# Patient Record
Sex: Female | Born: 1940 | Race: White | Hispanic: No | Smoking: Never smoker
Health system: Southern US, Community
[De-identification: ages and names within clinical notes are randomized; demographics above are authoritative.]

## PROBLEM LIST (undated history)

## (undated) DIAGNOSIS — Z8719 Personal history of other diseases of the digestive system: Secondary | ICD-10-CM

## (undated) DIAGNOSIS — N329 Bladder disorder, unspecified: Secondary | ICD-10-CM

## (undated) DIAGNOSIS — K219 Gastro-esophageal reflux disease without esophagitis: Secondary | ICD-10-CM

## (undated) DIAGNOSIS — M1990 Unspecified osteoarthritis, unspecified site: Secondary | ICD-10-CM

## (undated) DIAGNOSIS — Z87442 Personal history of urinary calculi: Secondary | ICD-10-CM

---

## 2007-02-13 HISTORY — PX: PARTIAL COLECTOMY: SHX5273

## 2011-07-17 ENCOUNTER — Other Ambulatory Visit: Payer: Self-pay | Admitting: Urology

## 2011-07-20 ENCOUNTER — Other Ambulatory Visit (HOSPITAL_COMMUNITY): Payer: Self-pay | Admitting: Urology

## 2011-07-20 DIAGNOSIS — D49519 Neoplasm of unspecified behavior of unspecified kidney: Secondary | ICD-10-CM

## 2011-07-20 DIAGNOSIS — D490 Neoplasm of unspecified behavior of digestive system: Secondary | ICD-10-CM

## 2011-07-20 DIAGNOSIS — R310 Gross hematuria: Secondary | ICD-10-CM

## 2011-07-27 ENCOUNTER — Ambulatory Visit (HOSPITAL_COMMUNITY)
Admission: RE | Admit: 2011-07-27 | Discharge: 2011-07-27 | Disposition: A | Discharge status: Home / Self Care | Payer: MEDICARE | Source: Ambulatory Visit | Attending: Urology | Admitting: Urology

## 2011-07-27 DIAGNOSIS — K449 Diaphragmatic hernia without obstruction or gangrene: Secondary | ICD-10-CM | POA: Insufficient documentation

## 2011-07-27 DIAGNOSIS — K863 Pseudocyst of pancreas: Secondary | ICD-10-CM | POA: Insufficient documentation

## 2011-07-27 DIAGNOSIS — N281 Cyst of kidney, acquired: Secondary | ICD-10-CM | POA: Insufficient documentation

## 2011-07-27 DIAGNOSIS — D49519 Neoplasm of unspecified behavior of unspecified kidney: Secondary | ICD-10-CM

## 2011-07-27 DIAGNOSIS — D490 Neoplasm of unspecified behavior of digestive system: Secondary | ICD-10-CM

## 2011-07-27 DIAGNOSIS — R310 Gross hematuria: Secondary | ICD-10-CM | POA: Insufficient documentation

## 2011-07-27 DIAGNOSIS — K862 Cyst of pancreas: Secondary | ICD-10-CM | POA: Insufficient documentation

## 2011-07-27 DIAGNOSIS — K7689 Other specified diseases of liver: Secondary | ICD-10-CM | POA: Insufficient documentation

## 2011-07-27 MED ORDER — GADOBENATE DIMEGLUMINE 529 MG/ML IV SOLN
17.0000 mL | Freq: Once | INTRAVENOUS | Status: AC | PRN
  Administered 2011-07-27: 17 mL via INTRAVENOUS

## 2011-07-31 ENCOUNTER — Encounter (HOSPITAL_BASED_OUTPATIENT_CLINIC_OR_DEPARTMENT_OTHER): Payer: Self-pay | Admitting: *Deleted

## 2011-07-31 NOTE — Progress Notes (Signed)
NPO AFTER MN. ARRIVES AT 0930. NEEDS HG AND EKG. WILL TAKE PROTONIX AND LIPITOR AM OF SURG. W/ SIP OF WATER.

## 2011-08-06 ENCOUNTER — Encounter (HOSPITAL_BASED_OUTPATIENT_CLINIC_OR_DEPARTMENT_OTHER): Payer: Self-pay

## 2011-08-06 ENCOUNTER — Encounter (HOSPITAL_BASED_OUTPATIENT_CLINIC_OR_DEPARTMENT_OTHER)
Admission: RE | Disposition: A | Discharge status: Home / Self Care | Payer: Self-pay | Source: Ambulatory Visit | Attending: Urology

## 2011-08-06 ENCOUNTER — Encounter (HOSPITAL_BASED_OUTPATIENT_CLINIC_OR_DEPARTMENT_OTHER): Payer: Self-pay | Admitting: *Deleted

## 2011-08-06 ENCOUNTER — Ambulatory Visit (HOSPITAL_BASED_OUTPATIENT_CLINIC_OR_DEPARTMENT_OTHER)
Admission: RE | Admit: 2011-08-06 | Discharge: 2011-08-06 | Disposition: A | Discharge status: Home / Self Care | Payer: MEDICARE | Source: Ambulatory Visit | Attending: Urology | Admitting: Urology

## 2011-08-06 ENCOUNTER — Ambulatory Visit (HOSPITAL_BASED_OUTPATIENT_CLINIC_OR_DEPARTMENT_OTHER): Payer: MEDICARE

## 2011-08-06 ENCOUNTER — Encounter (HOSPITAL_BASED_OUTPATIENT_CLINIC_OR_DEPARTMENT_OTHER): Payer: Self-pay | Admitting: Anesthesiology

## 2011-08-06 DIAGNOSIS — Z79899 Other long term (current) drug therapy: Secondary | ICD-10-CM | POA: Insufficient documentation

## 2011-08-06 DIAGNOSIS — Z8739 Personal history of other diseases of the musculoskeletal system and connective tissue: Secondary | ICD-10-CM | POA: Insufficient documentation

## 2011-08-06 DIAGNOSIS — E785 Hyperlipidemia, unspecified: Secondary | ICD-10-CM | POA: Insufficient documentation

## 2011-08-06 DIAGNOSIS — R310 Gross hematuria: Secondary | ICD-10-CM | POA: Insufficient documentation

## 2011-08-06 DIAGNOSIS — K219 Gastro-esophageal reflux disease without esophagitis: Secondary | ICD-10-CM | POA: Insufficient documentation

## 2011-08-06 DIAGNOSIS — K449 Diaphragmatic hernia without obstruction or gangrene: Secondary | ICD-10-CM | POA: Insufficient documentation

## 2011-08-06 DIAGNOSIS — N210 Calculus in bladder: Secondary | ICD-10-CM

## 2011-08-06 DIAGNOSIS — D303 Benign neoplasm of bladder: Secondary | ICD-10-CM | POA: Insufficient documentation

## 2011-08-06 HISTORY — PX: CYSTOSCOPY WITH BIOPSY: SHX5122

## 2011-08-06 HISTORY — DX: Bladder disorder, unspecified: N32.9

## 2011-08-06 HISTORY — DX: Gastro-esophageal reflux disease without esophagitis: K21.9

## 2011-08-06 HISTORY — DX: Personal history of urinary calculi: Z87.442

## 2011-08-06 HISTORY — DX: Unspecified osteoarthritis, unspecified site: M19.90

## 2011-08-06 HISTORY — DX: Personal history of other diseases of the digestive system: Z87.19

## 2011-08-06 SURGERY — CYSTOSCOPY, WITH BIOPSY
Anesthesia: General | Site: Bladder | Wound class: Clean Contaminated

## 2011-08-06 MED ORDER — FENTANYL CITRATE 0.05 MG/ML IJ SOLN
INTRAMUSCULAR | Status: DC | PRN
  Administered 2011-08-06 (×4): 25 ug via INTRAVENOUS

## 2011-08-06 MED ORDER — BELLADONNA ALKALOIDS-OPIUM 16.2-60 MG RE SUPP
RECTAL | Status: DC | PRN
  Administered 2011-08-06: 1 via RECTAL

## 2011-08-06 MED ORDER — KETOROLAC TROMETHAMINE 30 MG/ML IJ SOLN: 15.0000 mg | Freq: Once | INTRAMUSCULAR | Status: DC | PRN

## 2011-08-06 MED ORDER — CIPROFLOXACIN IN D5W 200 MG/100ML IV SOLN
200.0000 mg | INTRAVENOUS | Status: AC
  Administered 2011-08-06: 200 mg via INTRAVENOUS

## 2011-08-06 MED ORDER — STERILE WATER FOR IRRIGATION IR SOLN
Status: DC | PRN
  Administered 2011-08-06: 3000 mL

## 2011-08-06 MED ORDER — LACTATED RINGERS IV SOLN
INTRAVENOUS | Status: DC | PRN
  Administered 2011-08-06: 10:00:00 via INTRAVENOUS

## 2011-08-06 MED ORDER — PROMETHAZINE HCL 25 MG/ML IJ SOLN: 6.2500 mg | INTRAMUSCULAR | Status: DC | PRN

## 2011-08-06 MED ORDER — FENTANYL CITRATE 0.05 MG/ML IJ SOLN: 25.0000 ug | INTRAMUSCULAR | Status: DC | PRN

## 2011-08-06 MED ORDER — LACTATED RINGERS IV SOLN
INTRAVENOUS | Status: DC
  Administered 2011-08-06: 10:00:00 via INTRAVENOUS

## 2011-08-06 MED ORDER — PROPOFOL 10 MG/ML IV EMUL
INTRAVENOUS | Status: DC | PRN
  Administered 2011-08-06: 180 mg via INTRAVENOUS

## 2011-08-06 MED ORDER — ONDANSETRON HCL 4 MG/2ML IJ SOLN
INTRAMUSCULAR | Status: DC | PRN
  Administered 2011-08-06: 4 mg via INTRAVENOUS

## 2011-08-06 MED ORDER — PHENAZOPYRIDINE HCL 200 MG PO TABS
200.0000 mg | ORAL_TABLET | Freq: Once | ORAL | Status: AC
  Administered 2011-08-06: 200 mg via ORAL

## 2011-08-06 MED ORDER — KETOROLAC TROMETHAMINE 30 MG/ML IJ SOLN
INTRAMUSCULAR | Status: DC | PRN
  Administered 2011-08-06: 30 mg via INTRAVENOUS

## 2011-08-06 MED ORDER — LIDOCAINE HCL (CARDIAC) 20 MG/ML IV SOLN
INTRAVENOUS | Status: DC | PRN
  Administered 2011-08-06: 60 mg via INTRAVENOUS

## 2011-08-06 MED ORDER — DEXAMETHASONE SODIUM PHOSPHATE 4 MG/ML IJ SOLN
INTRAMUSCULAR | Status: DC | PRN
  Administered 2011-08-06: 4 mg via INTRAVENOUS

## 2011-08-06 MED ORDER — PHENAZOPYRIDINE HCL 200 MG PO TABS: 200.0000 mg | ORAL_TABLET | Freq: Three times a day (TID) | ORAL | Status: AC | PRN

## 2011-08-06 SURGICAL SUPPLY — 15 items
BAG DRAIN URO-CYSTO SKYTR STRL (DRAIN) ×2 IMPLANT
CANISTER SUCT LVC 12 LTR MEDI- (MISCELLANEOUS) ×2 IMPLANT
CLOTH BEACON ORANGE TIMEOUT ST (SAFETY) ×2 IMPLANT
DRAPE CAMERA CLOSED 9X96 (DRAPES) ×2 IMPLANT
ELECT REM PT RETURN 9FT ADLT (ELECTROSURGICAL) ×2
ELECTRODE REM PT RTRN 9FT ADLT (ELECTROSURGICAL) ×1 IMPLANT
GLOVE BIO SURGEON STRL SZ8 (GLOVE) ×2 IMPLANT
GOWN PREVENTION PLUS LG XLONG (DISPOSABLE) ×2 IMPLANT
GOWN STRL NON-REIN LRG LVL3 (GOWN DISPOSABLE) ×2 IMPLANT
GOWN STRL REIN XL XLG (GOWN DISPOSABLE) ×4 IMPLANT
IV NS IRRIG 3000ML ARTHROMATIC (IV SOLUTION) IMPLANT
NEEDLE HYPO 22GX1.5 SAFETY (NEEDLE) IMPLANT
NS IRRIG 500ML POUR BTL (IV SOLUTION) IMPLANT
PACK CYSTOSCOPY (CUSTOM PROCEDURE TRAY) ×2 IMPLANT
WATER STERILE IRR 3000ML UROMA (IV SOLUTION) ×2 IMPLANT

## 2011-08-06 NOTE — Op Note (Signed)
PATIENT:  Shannon Villegas  PRE-OPERATIVE DIAGNOSIS:  Bladder lesion  POST-OPERATIVE DIAGNOSIS:  Same  PROCEDURE:  Procedure(s): Cystoscopy with cold cup bladder biopsy  SURGEON:  Garnett Farm  INDICATION: Mrs. Messing is a 71 year old female who had experienced gross hematuria and was evaluated with a CT scan that revealed no abnormality of the upper tract but there was a stone seen in the bladder. Cystoscopically I found, in my office, an 8 mm Jack stone in her bladder which I dislodged from where it was attached on the bladder wall and extracted the stone. The patient was having a lot of irritative voiding symptoms which resolved with removal of the stone. It was attached to an area that appeared abnormal and we discussed the fact that it could be inflammation versus transitional cell carcinoma and that further evaluation with a bladder biopsy was indicated.  ANESTHESIA:  General  EBL:  Minimal  DRAINS: None  LOCAL MEDICATIONS USED:  None  SPECIMEN:  Cold cup biopsy from the lesion on the floor of the bladder just medial to the left ureteral orifice  DISPOSITION OF SPECIMEN:  PATHOLOGY  Description of procedure: After informed consent the patient was taken to the major OR, placed on the table and administered general anesthesia. During placement of her LMA a tooth was dislodged. It was extremely loose initially and appeared to be in poor shape. It was removed and placed in a cup. Once under anesthesia she was moved to the dorsal lithotomy position and her genitalia sterilely prepped and draped. An official timeout was then performed.  Rigid cystoscopy was performed using the 22 French cystoscope with 12 lens initially. Ureter was noted be normal. The bladder was entered and the ureteral orifices were again noted to be a normal position and configuration. The area where the stone had been adherent to the floor of the bladder was again identified although the degree of apparent  inflammation was somewhat decreased. The bladder was fully inspected with both the 12 and 70 lenses and no other lesions were identified.  A cold cup biopsy forceps was then passed under direct vision in the bladder and 2 cold cup biopsies were obtained from the area in question on the floor of the bladder and sent to pathology. I then fulgurated this region with the Bugbee electrode and no bleeding was noted at the end of the procedure. The bladder was drained, the cystoscope removed and the patient awakened and taken recovery room in stable and satisfactory condition. She tolerated the procedure well no intraoperative complications.  PLAN  OF CARE: Discharge to home after PACU  PATIENT DISPOSITION:  PACU - hemodynamically stable.

## 2011-08-06 NOTE — Discharge Instructions (Signed)
Post Bladder Surgery Instructions ° ° °General instructions: °   ° Your recent bladder surgery requires very little post hospital care but some definite precautions. ° °Despite the fact that no skin incisions were used, the area around the bladder incisions are raw and covered with scabs to promote healing and prevent bleeding. Certain precautions are needed to insure that the scabs are not disturbed over the next 2-4 weeks while the healing proceeds. ° °Because the raw surface inside your bladder and the irritating effects of urine you may expect frequency of urination and/or urgency (a stronger desire to urinate) and perhaps even getting up at night more often. This will usually resolve or improve slowly over the healing period. You may see some blood in your urine over the first 6 weeks. Do not be alarmed, even if the urine was clear for a while. Get off your feet and drink lots of fluids until clearing occurs. If you start to pass clots or don't improve call us. ° °Catheter: (If you are discharged with a catheter.) ° °1. Keep your catheter secured to your leg at all times with tape or the supplied strap. °2. You may experience leakage of urine around your catheter- as long as the  °catheter continues to drain, this is normal.  If your catheter stops draining  °go to the ER. °3. You may also have blood in your urine, even after it has been clear for  °several days; you may even pass some small blood clots or other material.  This  °is normal as well.  If this happens, sit down and drink plenty of water to help  °make urine to flush out your bladder.  If the blood in your urine becomes worse  °after doing this, contact our office or return to the ER. °4. You may use the leg bag (small bag) during the day, but use the large bag at  °night. ° °Diet: ° °You may return to your normal diet immediately. Because of the raw surface of your bladder, alcohol, spicy foods, foods high in acid and drinks with caffeine may  cause irritation or frequency and should be used in moderation. To keep your urine flowing freely and avoid constipation, drink plenty of fluids during the day (8-10 glasses). Tip: Avoid cranberry juice because it is very acidic. ° °Activity: ° °Your physical activity doesn't need to be restricted. However, if you are very active, you may see some blood in the urine. We suggest that you reduce your activity under the circumstances until the bleeding has stopped. ° °Bowels: ° °It is important to keep your bowels regular during the postoperative period. Straining with bowel movements can cause bleeding. A bowel movement every other day is reasonable. Use a mild laxative if needed, such as milk of magnesia 2-3 tablespoons, or 2 Dulcolax tablets. Call if you continue to have problems. If you had been taking narcotics for pain, before, during or after your surgery, you may be constipated. Take a laxative if necessary. ° ° ° °Medication: ° °You should resume your pre-surgery medications unless told not to. In addition you may be given an antibiotic to prevent or treat infection. Antibiotics are not always necessary. All medication should be taken as prescribed until the bottles are finished unless you are having an unusual reaction to one of the drugs. ° ° °Post Anesthesia Home Care Instructions ° °Activity: °Get plenty of rest for the remainder of the day. A responsible adult should stay with you for   24 hours following the procedure.  °For the next 24 hours, DO NOT: °-Drive a car °-Operate machinery °-Drink alcoholic beverages °-Take any medication unless instructed by your physician °-Make any legal decisions or sign important papers. ° °Meals: °Start with liquid foods such as gelatin or soup. Progress to regular foods as tolerated. Avoid greasy, spicy, heavy foods. If nausea and/or vomiting occur, drink only clear liquids until the nausea and/or vomiting subsides. Call your physician if vomiting continues. ° °Special  Instructions/Symptoms: °Your throat may feel dry or sore from the anesthesia or the breathing tube placed in your throat during surgery. If this causes discomfort, gargle with warm salt water. The discomfort should disappear within 24 hours. ° ° °

## 2011-08-06 NOTE — Progress Notes (Signed)
Spoke with patient post operatively about tooth removal during induction. Also informed patient about severely loose tooth on opposite side. Only one tooth on bottom appears to be fully anchored. Patient voiced understanding

## 2011-08-06 NOTE — H&P (Signed)
History of Present Illness         Gross hematuria: The patient reported approximately 3 weeks of dysuria and frequency prior to being seen by Dr. Ivory Broad on 06/17/11. A urinalysis at that time was positive for blood by dipstick and urine culture was performed and found to be negative. She reports she first saw gross hematuria about 7 weeks ago and then her urine was visibly clear but also had another episode of gross hematuria as recently 24 hours prior to her last visit.   She does have nocturia 4-5X which she says she has had all of her life. She doesn't really have any daytime voiding symptoms but may have some slight urinary frequency.   Interval history: She notes that she has had less irritative symptoms since I have seen her. No further gross hematuria noted.   Past Medical History Problems  1. History of  Esophageal Reflux 530.81 2. History of  Hiatal Hernia 553.3 3. History of  Hyperlipidemia 272.4 4. History of  Osteoarthritis V13.4  Surgical History Problems  1. History of  Colon Surgery  Current Meds 1. CeleBREX 200 MG Oral Capsule; Therapy: (Recorded:23May2013) to 2. Cyclobenzaprine HCl 5 MG Oral Tablet; Therapy: (Recorded:23May2013) to 3. Iron TABS; Therapy: (Recorded:23May2013) to 4. Lipitor 40 MG Oral Tablet; Therapy: (Recorded:23May2013) to 5. Pantoprazole Sodium 40 MG Oral Tablet Delayed Release; Therapy: (Recorded:23May2013) to 6. Vitamin D 400 UNIT Oral Capsule; Therapy: (Recorded:23May2013) to  Allergies Medication  1. No Known Drug Allergies  Family History Problems  1. Maternal history of  Diabetes Mellitus V18.0 2. Paternal history of  Heart Disease V17.49 3. Sororal history of  Hypertension V17.49 4. Sororal history of  Lung Cancer V16.1  Social History Problems  1. Marital History - Widowed 2. Never A Smoker Denied  3. History of  Alcohol Use  Review of Systems Genitourinary, constitutional, skin, eye, otolaryngeal, hematologic/lymphatic,  cardiovascular, pulmonary, endocrine, musculoskeletal, gastrointestinal, neurological and psychiatric system(s) were reviewed and pertinent findings if present are noted.  Genitourinary: urinary frequency, dysuria, nocturia and hematuria.    Vitals Vital Signs  Blood Pressure: 152 / 86 Heart Rate: 80  BMI Calculated: 29.41 BSA Calculated: 1.93 Height: 5 ft 6 in Weight: 183 lb   Physical Exam Constitutional: Well nourished and well developed . No acute distress.  ENT:. The ears and nose are normal in appearance.  Neck: The appearance of the neck is normal and no neck mass is present.  Pulmonary: No respiratory distress and normal respiratory rhythm and effort.  Cardiovascular: Heart rate and rhythm are normal . No peripheral edema.  Abdomen: The abdomen is soft and nontender. No masses are palpated. No CVA tenderness. No hernias are palpable. No hepatosplenomegaly noted.  Lymphatics: The femoral and inguinal nodes are not enlarged or tender.  Skin: Normal skin turgor, no visible rash and no visible skin lesions.  Neuro/Psych:. Mood and affect are appropriate.    Results/Data  The following images/tracing/specimen were independently visualized:  CT scan as below.  The following clinical lab reports were reviewed:  Creatinine as below.  The following radiology reports were reviewed: CT scan. Selected Results  AU CT-HEMATURIA PROTOCOL  Ihor Gully   Test Name Result Flag Reference  ** RADIOLOGY REPORT BY [facility] ** ORIGINAL APPROVED BY: Florencia Reasons, M.D. ON: 07/12/2011 13:04:37   *RADIOLOGY REPORT*  Clinical Data: Gross hematuria.  CT ABDOMEN AND PELVIS WITHOUT AND WITH CONTRAST  Technique: Multidetector CT imaging of the abdomen and pelvis was performed without  contrast material in one or both body regions, followed by contrast material(s) and further sections in one or both body regions.  Contrast: 125 ml of Isovue 300.  Comparison: No  priors.  Findings:  Lung Bases: Massive hiatal hernia with a large portion of the stomach in an intrathoracic position. Otherwise, unremarkable.  Abdomen/Pelvis: Noncontrast images demonstrate no definite calcifications within the collecting system of either kidney or along the course of either ureter. Image 85 of series 2 demonstrates a large calcification within the dependent portion of the urinary bladder adjacent to the urethral orifice. Additionally, image number 37 of series 2 demonstrates some ill- defined calcifications in the lateral aspect of the lower pole of the right kidney. This corresponds to a somewhat irregular shaped lesion in the measures 1.8 x 1.3 cm, which measures approximately 26 HU on precontrast images, 48 HU on portal venous phase images, and 26 HU on the post contrast delayed images, concerning for an enhancing lesion. No additional renal lesions are otherwise noted. Post contrast delayed images demonstrate no definite filling defects within the collecting system of either kidney, along the course of either ureter (please note that the distal half of the right ureter was incompletely opacified on delayed images), or within the lumen of the urinary bladder to strongly suggest the presence of a urothelial neoplasm.  In segment 3 of the liver (image 33 of series 3) there is a 1.2 cm lesion which measures 30 HU on noncontrast images increases to 47 HU on the portal venous phase images (45 HU on delayed images). No additional hepatic lesions are otherwise noted. The appearance of the gallbladder, pancreas, spleen and bilateral adrenal glands is unremarkable. There is extensive atherosclerosis throughout the abdominal and pelvic vasculature, without evidence of aneurysm or dissection. There appear to be single renal arteries bilaterally. There is no ascites or pneumoperitoneum and no pathologic distension of bowel. Postoperative changes of the  right hemicolectomy are noted. No definite pathologic lymphadenopathy identified within the abdomen or pelvis. In the anterior aspect of the uterine body there is a 3.7 x 4.3 x 4.1 cm avidly enhancing lesion, most compatible with a fibroid. Laxity of the levator ani musculature and low-lying pelvic organs suggests pelvic organ prolapse.  Musculoskeletal: There are no aggressive appearing lytic or blastic lesions noted in the visualized portions of the skeleton. 6 mm of anterolisthesis of L4 upon L5 is noted.  IMPRESSION: 1. 7 mm calcification in the inferior aspect of the urinary bladder adjacent to the urethral orifice presumably represents a urinary tract calculus. No other calculi are otherwise noted. 2. Indeterminate 1.8 x 1.3 cm lesion in the right kidney which has some fine internal calcifications and appears to demonstrate some potential enhancement. This is suspicious, and warrants further evaluation with MRI to exclude a cystic renal neoplasm. 3. In addition, there is a 1.2 cm lesion in the posterior aspect of segment three of the liver which has indeterminate imaging characteristics, but demonstrates potential enhancement. This too could be better evaluated with contrast enhanced MRI of the abdomen. 4. 3.7 x 4.3 x 4.1 cm avidly enhancing lesion in the anterior aspect of the uterine body presumably represents a fibroid. 5. Laxity of the levator ani musculature and low-lying pelvic organs suggests underlying pelvic organ prolapse. Clinical correlation may be warranted. 6. 6 mm of anterolisthesis of L4 upon L5.   BUN & CREATININE  Ihor Gully  SPECIMEN TYPE: BLOOD   Test Name Result Flag Reference  CREATININE 0.68 mg/dL  9.60-4.54  BUN 22 mg/dL  1-61  Est GFR, African American >89 mL/min    Est GFR, NonAfrican American 88 mL/min    The estimated GFR is a calculation valid for adults (4 to 73 years old) that uses the CKD-EPI algorithm to adjust for age and sex.  It is not to be used for children, pregnant women, hospitalized patients, patients on dialysis, or with rapidly changing kidney function. According to the NKDEP, eGFR >89 is normal, 60-89 shows mild impairment, 30-59 shows moderate impairment, 15-29 shows severe impairment and <15 is ESRD.   Procedure  Procedure: Cystoscopy  Chaperone Present: .  Indication: Hematuria.  Informed Consent: Risks, benefits, and potential adverse events were discussed and informed consent was obtained from the patient.  Prep: The patient was prepped with betadine.  Procedure Note:  Urethral meatus:. No abnormalities.  Anterior urethra: No abnormalities.  Bladder: Visulization was clear. The ureteral orifices were in the normal anatomic position bilaterally and had clear efflux of urine. A systematic survey of the bladder demonstrated no bladder tumors or stones. The mucosa was smooth without abnormalities. Examination of the bladder demonstrated edema located near the left ureteral orifice of the bladder. She also had a stone in her bladder. It appeared to be a jack stone configuration but only measured about 8 mm in size. I was able to dislodge it from the floor of the bladder and grasped it with graspers and was able to extract the stone without difficulty. The patient tolerated the procedure well.  Complications: None.    Assessment Assessed  1. Gross Hematuria 599.71 2. Bladder Calculus 594.1 3. Possible  Renal Neoplasm Right 239.5 4. Possible  Liver Neoplasm 239.0      I went over the results of her workup so far which is revealed a normal serum creatinine. The CT scan has revealed a cystic lesion within the right kidney that measured 1.8 cm in greatest diameter and had some stippled calcifications in the wall which would make it a Bosniak class IIF cyst at the least and could possibly be a cystic renal neoplasm. This does warrant further evaluation but likely did not cause the gross hematuria. There also  did appear to be a possible lesion within the liver as well. The radiologist mentioned a calcification in the bladder that could be a bladder stone although my concern was that of dystrophic calcification of a bladder tumor. Cystoscopically what I found was a definite bladder calculi that appear to be adherent to the bladder mucosa just medial to the left ureteral orifice. There was surrounding abnormality of the mucosa that could be inflammation due to irritation from a stone versus transitional cell carcinoma. I discussed that with the patient and the need to biopsy this region. I went over the procedure with her in detail including its risks and complications. Now that the stone is gone if it is inflammatory in nature that should begin to resolve.   Plan       1. MRI scan of the abdomen with and without contrast. 2. She will be scheduled for outpatient bladder biopsy. 3. Her stone will be sent for compositional analysis.   Addendum: MRI scan was obtained and revealed the area in within the liver appeared to be a simple cyst. End in addition the right renal lesion was noted to be a complex cyst Bosniak class II.

## 2011-08-06 NOTE — Transfer of Care (Signed)
Immediate Anesthesia Transfer of Care Note  Patient: Shannon Villegas  Procedure(s) Performed: Procedure(s) (LRB): CYSTOSCOPY WITH BIOPSY (N/A)  Patient Location: PACU  Anesthesia Type: General  Level of Consciousness: awake, sedated, patient cooperative and responds to stimulation  Airway & Oxygen Therapy: Patient Spontanous Breathing and Patient connected to face mask oxygen  Post-op Assessment: Report given to PACU RN, Post -op Vital signs reviewed and stable and Patient moving all extremities  Post vital signs: Reviewed and stable  Complications: No apparent anesthesia complications . Note lower right tooth came out during LMA placement. Socket with minimum bloody drainage. No distress with airway management. Teeth poor preop. All loose along bottom, only one more intact than other 2 remaining. Discussed with patient.  Her response was " It doesn't surprise me" when told her tooth came out. SaO2 stable awake and alert. Report and tooth labeled in specimen cup given to PACU RN.

## 2011-08-06 NOTE — Interval H&P Note (Signed)
History and Physical Interval Note:  08/06/2011 10:41 AM  Shannon Villegas  has presented today for surgery, with the diagnosis of Bladder Lesion  The various methods of treatment have been discussed with the patient and family. After consideration of risks, benefits and other options for treatment, the patient has consented to  Procedure(s) (LRB): CYSTOSCOPY WITH BIOPSY (N/A) as a surgical intervention .  The patient's history has been reviewed, patient examined, no change in status, stable for surgery.  I have reviewed the patients' chart and labs.  Questions were answered to the patient's satisfaction.     Garnett Farm

## 2011-08-06 NOTE — Anesthesia Procedure Notes (Signed)
Procedure Name: LMA Insertion Date/Time: 08/06/2011 10:50 AM Performed by: Jessica Priest Pre-anesthesia Checklist: Patient identified, Emergency Drugs available, Suction available and Patient being monitored Patient Re-evaluated:Patient Re-evaluated prior to inductionOxygen Delivery Method: Circle System Utilized Preoxygenation: Pre-oxygenation with 100% oxygen Intubation Type: IV induction Ventilation: Mask ventilation without difficulty LMA: LMA inserted LMA Size: 4.0 Number of attempts: 1 Airway Equipment and Method: bite block Placement Confirmation: positive ETCO2 Tube secured with: Tape Dental Injury: Teeth and Oropharynx as per pre-operative assessment and Dental damage  Comments: Poor dentition, several teeth loose on bottom, inserted LMA supreme gently, lower right tooth came out, left lower tooth very loose, caution with taping LMA, Dr Okey Dupre at bedside. No distress with ventilation, Sa02 stable.

## 2011-08-06 NOTE — Anesthesia Postprocedure Evaluation (Signed)
  Anesthesia Post-op Note  Patient: Shannon Villegas  Procedure(s) Performed: Procedure(s) (LRB): CYSTOSCOPY WITH BIOPSY (N/A)  Patient Location: PACU  Anesthesia Type: General  Level of Consciousness: awake and alert   Airway and Oxygen Therapy: Patient Spontanous Breathing  Post-op Pain: mild  Post-op Assessment: Post-op Vital signs reviewed, Patient's Cardiovascular Status Stable, Respiratory Function Stable, Patent Airway and No signs of Nausea or vomiting  Post-op Vital Signs: stable  Complications: tooth removed during induction b/c of looseness

## 2011-08-06 NOTE — Anesthesia Preprocedure Evaluation (Addendum)
Anesthesia Evaluation  Patient identified by MRN, date of birth, ID band Patient awake    Reviewed: Allergy & Precautions, H&P , NPO status , Patient's Chart, lab work & pertinent test results  Airway Mallampati: II TM Distance: <3 FB Neck ROM: Full    Dental  (+) Upper Dentures, Partial Lower, Loose, Poor Dentition and Dental Advisory Given,  High possibility of losing 1 or 2 teeth during airway placement:   Pulmonary neg pulmonary ROS,  breath sounds clear to auscultation  Pulmonary exam normal       Cardiovascular negative cardio ROS  Rhythm:Regular Rate:Normal     Neuro/Psych negative neurological ROS  negative psych ROS   GI/Hepatic Neg liver ROS, GERD-  Medicated,  Endo/Other  negative endocrine ROS  Renal/GU negative Renal ROS  negative genitourinary   Musculoskeletal negative musculoskeletal ROS (+)   Abdominal   Peds negative pediatric ROS (+)  Hematology negative hematology ROS (+)   Anesthesia Other Findings   Reproductive/Obstetrics negative OB ROS                        Anesthesia Physical Anesthesia Plan  ASA: II  Anesthesia Plan: General   Post-op Pain Management:    Induction: Intravenous  Airway Management Planned: LMA  Additional Equipment:   Intra-op Plan:   Post-operative Plan:   Informed Consent: I have reviewed the patients History and Physical, chart, labs and discussed the procedure including the risks, benefits and alternatives for the proposed anesthesia with the patient or authorized representative who has indicated his/her understanding and acceptance.   Dental advisory given  Plan Discussed with:   Anesthesia Plan Comments:         Anesthesia Quick Evaluation

## 2011-08-07 LAB — POCT HEMOGLOBIN-HEMACUE: Hemoglobin: 11.5 g/dL — ABNORMAL LOW (ref 12.0–15.0)

## 2011-08-08 ENCOUNTER — Encounter (HOSPITAL_BASED_OUTPATIENT_CLINIC_OR_DEPARTMENT_OTHER): Payer: Self-pay | Admitting: Urology

## 2012-07-19 DIAGNOSIS — M545 Low back pain, unspecified: Secondary | ICD-10-CM | POA: Insufficient documentation

## 2012-07-19 DIAGNOSIS — D649 Anemia, unspecified: Secondary | ICD-10-CM | POA: Insufficient documentation

## 2012-07-19 DIAGNOSIS — K219 Gastro-esophageal reflux disease without esophagitis: Secondary | ICD-10-CM | POA: Insufficient documentation

## 2012-07-19 DIAGNOSIS — M1990 Unspecified osteoarthritis, unspecified site: Secondary | ICD-10-CM | POA: Insufficient documentation

## 2012-10-21 DIAGNOSIS — J309 Allergic rhinitis, unspecified: Secondary | ICD-10-CM | POA: Insufficient documentation

## 2012-10-21 DIAGNOSIS — R609 Edema, unspecified: Secondary | ICD-10-CM | POA: Insufficient documentation

## 2012-12-17 ENCOUNTER — Inpatient Hospital Stay (HOSPITAL_COMMUNITY): Admission: RE | Admit: 2012-12-17 | Payer: MEDICARE | Source: Ambulatory Visit | Admitting: Orthopedic Surgery

## 2012-12-17 ENCOUNTER — Encounter (HOSPITAL_COMMUNITY): Admission: RE | Payer: Self-pay | Source: Ambulatory Visit

## 2012-12-17 SURGERY — ARTHROPLASTY, KNEE, TOTAL
Anesthesia: General | Site: Knee | Laterality: Left

## 2013-04-29 DIAGNOSIS — I10 Essential (primary) hypertension: Secondary | ICD-10-CM | POA: Insufficient documentation

## 2013-04-29 DIAGNOSIS — E785 Hyperlipidemia, unspecified: Secondary | ICD-10-CM | POA: Insufficient documentation

## 2013-05-04 DIAGNOSIS — J479 Bronchiectasis, uncomplicated: Secondary | ICD-10-CM | POA: Insufficient documentation

## 2014-07-11 DIAGNOSIS — J449 Chronic obstructive pulmonary disease, unspecified: Secondary | ICD-10-CM | POA: Insufficient documentation

## 2015-08-08 DIAGNOSIS — R5383 Other fatigue: Secondary | ICD-10-CM | POA: Insufficient documentation

## 2016-11-22 DIAGNOSIS — Z85828 Personal history of other malignant neoplasm of skin: Secondary | ICD-10-CM | POA: Insufficient documentation

## 2016-11-22 DIAGNOSIS — Z791 Long term (current) use of non-steroidal anti-inflammatories (NSAID): Secondary | ICD-10-CM | POA: Insufficient documentation

## 2016-11-22 DIAGNOSIS — D500 Iron deficiency anemia secondary to blood loss (chronic): Secondary | ICD-10-CM | POA: Insufficient documentation

## 2016-11-22 DIAGNOSIS — Z860100 Personal history of colon polyps, unspecified: Secondary | ICD-10-CM | POA: Insufficient documentation

## 2017-02-06 DIAGNOSIS — K279 Peptic ulcer, site unspecified, unspecified as acute or chronic, without hemorrhage or perforation: Secondary | ICD-10-CM | POA: Insufficient documentation

## 2017-05-07 ENCOUNTER — Other Ambulatory Visit: Payer: Self-pay | Admitting: Orthopedic Surgery

## 2017-05-07 DIAGNOSIS — G8929 Other chronic pain: Secondary | ICD-10-CM

## 2017-05-07 DIAGNOSIS — M545 Low back pain, unspecified: Secondary | ICD-10-CM

## 2017-05-08 ENCOUNTER — Ambulatory Visit
Admission: RE | Admit: 2017-05-08 | Discharge: 2017-05-08 | Disposition: A | Discharge status: Home / Self Care | Payer: MEDICARE | Source: Ambulatory Visit | Attending: Orthopedic Surgery | Admitting: Orthopedic Surgery

## 2017-05-08 DIAGNOSIS — M545 Low back pain, unspecified: Secondary | ICD-10-CM

## 2017-05-08 DIAGNOSIS — G8929 Other chronic pain: Secondary | ICD-10-CM

## 2017-05-27 ENCOUNTER — Ambulatory Visit: Payer: MEDICARE | Admitting: Cardiology

## 2017-05-30 ENCOUNTER — Ambulatory Visit (INDEPENDENT_AMBULATORY_CARE_PROVIDER_SITE_OTHER): Payer: MEDICARE | Admitting: Cardiology

## 2017-05-30 ENCOUNTER — Encounter: Payer: Self-pay | Admitting: Cardiology

## 2017-05-30 VITALS — BP 134/80 | HR 102 | Ht 66.0 in | Wt 165.0 lb

## 2017-05-30 DIAGNOSIS — R600 Localized edema: Secondary | ICD-10-CM | POA: Insufficient documentation

## 2017-05-30 DIAGNOSIS — I10 Essential (primary) hypertension: Secondary | ICD-10-CM | POA: Diagnosis not present

## 2017-05-30 NOTE — Progress Notes (Signed)
Cardiology Office Note:    Date:  05/30/2017   ID:  Shannon Villegas, DOB 10-Jul-1940, MRN [id number]  PCP:  Elisabeth Cara, PA-C  Cardiologist:  Jenean Lindau, MD   Referring MD: No ref. provider found    ASSESSMENT:    1. Essential hypertension   2. Pedal edema    PLAN:    In order of problems listed above:  1. Primary prevention stressed with the patient.  Importance of compliance with diet and medications stressed and she vocalized understanding.  The daughter mentions to me that she had a blood test which revealed that she did not have congestive heart failure.  Echocardiogram will be done to assess this in the murmur heard on auscultation.  Patient's bilateral pedal edema is significant and therefore we will do DVT study to rule out any deep venous thromboembolism.  Compression stockings and foot elevation in bed were recommended.  Also I told her to avoid excessive salt in the diet and this may be beneficial for hypertension and pedal edema. 2. Patient will be seen in follow-up appointment in 6 months or earlier if the patient has any concerns    Medication Adjustments/Labs and Tests Ordered: Current medicines are reviewed at length with the patient today.  Concerns regarding medicines are outlined above.  Orders Placed This Encounter  Procedures  . US Venous Img Lower Bilateral  . ECHOCARDIOGRAM COMPLETE   No orders of the defined types were placed in this encounter.    History of Present Illness:    Shannon Villegas is a 77 y.o. female who is being seen today for the evaluation of pedal edema at the request of her primary care physician.  Patient is here as she has history of pedal edema and her primary care doctor wanted to know she has an element of congestive heart failure.  Patient denies any chest pain orthopnea or PND.  She is an elderly lady and uses a walker to ambulate.  This is been a chronic problem for her for the past several weeks.  Again no  orthopnea or PND.  Past Medical History:  Diagnosis Date  . Arthritis KNEES AND FEET  . GERD (gastroesophageal reflux disease)   . H/O hiatal hernia   . History of urinary stone BLADDER STONE EXTRACTIN IN OFFICE JUNE 2013  . Lesion of bladder     Past Surgical History:  Procedure Laterality Date  . CYSTOSCOPY WITH BIOPSY  08/06/2011   Procedure: CYSTOSCOPY WITH BIOPSY;  Surgeon: Claybon Jabs, MD;  Location: [facility];  Service: Urology;  Laterality: N/A;  30 mins requested for this case  BLADDER BIOPSY  CAMERA GYRUS  . PARTIAL COLECTOMY  2009   RESECTION POLYPS (BENIGN)    Current Medications: Current Meds  Medication Sig  . amoxicillin (AMOXIL) 875 MG tablet   . Ferrous Sulfate (IRON) 325 (65 FE) MG TABS Take 1 tablet by mouth every evening.  . furosemide (LASIX) 40 MG tablet Take by mouth.  . gabapentin (NEURONTIN) 300 MG capsule   . loratadine (CLARITIN) 10 MG tablet Take by mouth.  . losartan (COZAAR) 100 MG tablet Take by mouth.  Marland Kitchen omeprazole (PRILOSEC) 40 MG capsule Take 40 mg by mouth.  . pantoprazole (PROTONIX) 40 MG tablet Take 40 mg by mouth every morning.  . potassium chloride (K-DUR) 10 MEQ tablet Take by mouth.  . [DISCONTINUED] spironolactone (ALDACTONE) 25 MG tablet Take by mouth.     Allergies:   Levofloxacin  Social History   Socioeconomic History  . Marital status: Widowed    Spouse name: Not on file  . Number of children: Not on file  . Years of education: Not on file  . Highest education level: Not on file  Occupational History  . Not on file  Social Needs  . Financial resource strain: Not on file  . Food insecurity:    Worry: Not on file    Inability: Not on file  . Transportation needs:    Medical: Not on file    Non-medical: Not on file  Tobacco Use  . Smoking status: Never Smoker  . Smokeless tobacco: Never Used  Substance and Sexual Activity  . Alcohol use: No  . Drug use: No  . Sexual activity: Not on file    Lifestyle  . Physical activity:    Days per week: Not on file    Minutes per session: Not on file  . Stress: Not on file  Relationships  . Social connections:    Talks on phone: Not on file    Gets together: Not on file    Attends religious service: Not on file    Active member of club or organization: Not on file    Attends meetings of clubs or organizations: Not on file    Relationship status: Not on file  Other Topics Concern  . Not on file  Social History Narrative  . Not on file     Family History: The patient's family history is not on file.  ROS:   Please see the history of present illness.    All other systems reviewed and are negative.  EKGs/Labs/Other Studies Reviewed:    The following studies were reviewed today: Discussed the findings of the EKG today which is unremarkable.  Sinus rhythm and nonspecific ST-T changes.   Recent Labs: No results found for requested labs within last 8760 hours.  Recent Lipid Panel No results found for: CHOL, TRIG, HDL, CHOLHDL, VLDL, LDLCALC, LDLDIRECT  Physical Exam:    VS:  BP 134/80 (BP Location: Right Arm, Patient Position: Sitting, Cuff Size: Normal)   Pulse (!) 102   Ht 5\' 6"  (1.676 m)   Wt 165 lb (74.8 kg)   SpO2 97%   BMI 26.63 kg/m     Wt Readings from Last 3 Encounters:  05/30/17 165 lb (74.8 kg)  07/31/11 175 lb (79.4 kg)     GEN: Patient is in no acute distress HEENT: Normal NECK: No JVD; No carotid bruits LYMPHATICS: No lymphadenopathy CARDIAC: S1 S2 regular, 2/6 systolic murmur at the apex. RESPIRATORY:  Clear to auscultation without rales, wheezing or rhonchi  ABDOMEN: Soft, non-tender, non-distended MUSCULOSKELETAL:  No edema; No deformity  SKIN: Warm and dry NEUROLOGIC:  Alert and oriented x 3 PSYCHIATRIC:  Normal affect    Signed, Jenean Lindau, MD  05/30/2017 2:50 PM    [facility]

## 2017-05-30 NOTE — Patient Instructions (Addendum)
Medication Instructions:  Your physician recommends that you continue on your current medications as directed. Please refer to the Current Medication list given to you today.  Labwork: None  Testing/Procedures: Your physician has requested that you have an echocardiogram. Echocardiography is a painless test that uses sound waves to create images of your heart. It provides your doctor with information about the size and shape of your heart and how well your heart's chambers and valves are working. This procedure takes approximately one hour. There are no restrictions for this procedure.  Your physician has requested that you have a lower or upper extremity venous duplex. This test is an ultrasound of the veins in the legs or arms. It looks at venous blood flow that carries blood from the heart to the legs or arms. Allow one hour for a Lower Venous exam. Allow thirty minutes for an Upper Venous exam. There are no restrictions or special instructions.   Follow-Up: Your physician recommends that you schedule a follow-up appointment in: 6 months  Any Other Special Instructions Will Be Listed Below (If Applicable).     If you need a refill on your cardiac medications before your next appointment, please call your pharmacy.   Hopkins, RN, BSN  Echocardiogram An echocardiogram, or echocardiography, uses sound waves (ultrasound) to produce an image of your heart. The echocardiogram is simple, painless, obtained within a short period of time, and offers valuable information to your health care provider. The images from an echocardiogram can provide information such as:  Evidence of coronary artery disease (CAD).  Heart size.  Heart muscle function.  Heart valve function.  Aneurysm detection.  Evidence of a past heart attack.  Fluid buildup around the heart.  Heart muscle thickening.  Assess heart valve function.  Tell a health care provider about:  Any  allergies you have.  All medicines you are taking, including vitamins, herbs, eye drops, creams, and over-the-counter medicines.  Any problems you or family members have had with anesthetic medicines.  Any blood disorders you have.  Any surgeries you have had.  Any medical conditions you have.  Whether you are pregnant or may be pregnant. What happens before the procedure? No special preparation is needed. Eat and drink normally. What happens during the procedure?  In order to produce an image of your heart, gel will be applied to your chest and a wand-like tool (transducer) will be moved over your chest. The gel will help transmit the sound waves from the transducer. The sound waves will harmlessly bounce off your heart to allow the heart images to be captured in real-time motion. These images will then be recorded.  You may need an IV to receive a medicine that improves the quality of the pictures. What happens after the procedure? You may return to your normal schedule including diet, activities, and medicines, unless your health care provider tells you otherwise. This information is not intended to replace advice given to you by your health care provider. Make sure you discuss any questions you have with your health care provider. Document Released: 01/27/2000 Document Revised: 09/17/2015 Document Reviewed: 10/06/2012 Elsevier Interactive Patient Education  2017 [employer].

## 2017-05-31 NOTE — Addendum Note (Signed)
Addended by: Jerl Santos R on: 05/31/2017 01:28 PM   Modules accepted: Orders

## 2017-06-06 ENCOUNTER — Ambulatory Visit (HOSPITAL_BASED_OUTPATIENT_CLINIC_OR_DEPARTMENT_OTHER): Payer: MEDICARE

## 2017-06-06 ENCOUNTER — Ambulatory Visit (HOSPITAL_BASED_OUTPATIENT_CLINIC_OR_DEPARTMENT_OTHER)
Admission: RE | Admit: 2017-06-06 | Discharge: 2017-06-06 | Disposition: A | Discharge status: Home / Self Care | Payer: MEDICARE | Source: Ambulatory Visit | Attending: Cardiology | Admitting: Cardiology

## 2017-06-06 DIAGNOSIS — I071 Rheumatic tricuspid insufficiency: Secondary | ICD-10-CM | POA: Insufficient documentation

## 2017-06-06 DIAGNOSIS — R600 Localized edema: Secondary | ICD-10-CM

## 2017-06-06 DIAGNOSIS — I313 Pericardial effusion (noninflammatory): Secondary | ICD-10-CM | POA: Insufficient documentation

## 2017-06-06 DIAGNOSIS — I10 Essential (primary) hypertension: Secondary | ICD-10-CM | POA: Diagnosis present

## 2017-06-06 DIAGNOSIS — E785 Hyperlipidemia, unspecified: Secondary | ICD-10-CM | POA: Insufficient documentation

## 2017-06-06 DIAGNOSIS — R609 Edema, unspecified: Secondary | ICD-10-CM | POA: Diagnosis present

## 2017-06-06 NOTE — Progress Notes (Signed)
  Echocardiogram 2D Echocardiogram has been performed.  Joelene Millin 06/06/2017, 3:05 PM

## 2017-06-06 NOTE — Addendum Note (Signed)
Addended by: Warner Mccreedy E on: 06/06/2017 10:44 AM   Modules accepted: Orders

## 2017-06-06 NOTE — Progress Notes (Signed)
  Lower extremity venous duplex performed. Shannon Villegas 06/06/2017, 3:17 PM

## 2017-11-22 ENCOUNTER — Ambulatory Visit (HOSPITAL_BASED_OUTPATIENT_CLINIC_OR_DEPARTMENT_OTHER)
Admission: RE | Admit: 2017-11-22 | Discharge: 2017-11-22 | Disposition: A | Discharge status: Home / Self Care | Payer: MEDICARE | Source: Ambulatory Visit | Attending: Cardiology | Admitting: Cardiology

## 2017-11-22 ENCOUNTER — Encounter: Payer: Self-pay | Admitting: Cardiology

## 2017-11-22 ENCOUNTER — Ambulatory Visit (INDEPENDENT_AMBULATORY_CARE_PROVIDER_SITE_OTHER): Payer: MEDICARE | Admitting: Cardiology

## 2017-11-22 VITALS — BP 140/64 | HR 77 | Ht 66.0 in | Wt 165.8 lb

## 2017-11-22 DIAGNOSIS — I272 Pulmonary hypertension, unspecified: Secondary | ICD-10-CM | POA: Diagnosis not present

## 2017-11-22 DIAGNOSIS — E785 Hyperlipidemia, unspecified: Secondary | ICD-10-CM | POA: Insufficient documentation

## 2017-11-22 DIAGNOSIS — I10 Essential (primary) hypertension: Secondary | ICD-10-CM | POA: Insufficient documentation

## 2017-11-22 DIAGNOSIS — J90 Pleural effusion, not elsewhere classified: Secondary | ICD-10-CM | POA: Insufficient documentation

## 2017-11-22 DIAGNOSIS — J449 Chronic obstructive pulmonary disease, unspecified: Secondary | ICD-10-CM | POA: Diagnosis not present

## 2017-11-22 DIAGNOSIS — I361 Nonrheumatic tricuspid (valve) insufficiency: Secondary | ICD-10-CM | POA: Diagnosis not present

## 2017-11-22 DIAGNOSIS — R0609 Other forms of dyspnea: Secondary | ICD-10-CM | POA: Diagnosis not present

## 2017-11-22 NOTE — Progress Notes (Signed)
  Echocardiogram 2D Echocardiogram has been performed.  Shannon Villegas 11/22/2017, 5:00 PM

## 2017-11-22 NOTE — Progress Notes (Signed)
Cardiology Office Note:    Date:  11/22/2017   ID:  Shannon Villegas, DOB [birth year], MRN [id number]  PCP:  Elisabeth Cara, PA-C  Cardiologist:  Jenne Campus, MD    Referring MD: Elisabeth Cara, *   Chief Complaint  Patient presents with  . Shortness of Breath  . Plueral Effusion  I am still short of breath and have swollen legs  History of Present Illness:    Shannon Villegas is a 77 y.o. female with swelling of lower extremities dyspnea on exertion mild/small pericardial effusion, pleural effusions she was referred to [place] for reevaluation she was seen in the springtime echocardiogram was done echocardiogram showed normal/preserved left ventricular ejection fraction.  Diastolic function was not assessed.  Left atrium was mildly enlarged.  No pulmonary hypertension however progressive he is getting worse and swelling of lower extremity became worse she required diuretic.  She also had to get up many times during the night and urinate.  He does not have paroxysmal nocturnal dyspnea no chest pain tightness squeezing pressure burning chest.  Past Medical History:  Diagnosis Date  . Arthritis KNEES AND FEET  . GERD (gastroesophageal reflux disease)   . H/O hiatal hernia   . History of urinary stone BLADDER STONE EXTRACTIN IN OFFICE JUNE 2013  . Lesion of bladder     Past Surgical History:  Procedure Laterality Date  . CYSTOSCOPY WITH BIOPSY  08/06/2011   Procedure: CYSTOSCOPY WITH BIOPSY;  Surgeon: Claybon Jabs, MD;  Location: [facility];  Service: Urology;  Laterality: N/A;  30 mins requested for this case  BLADDER BIOPSY  CAMERA GYRUS  . PARTIAL COLECTOMY  2009   RESECTION POLYPS (BENIGN)    Current Medications: Current Meds  Medication Sig  . Ferrous Sulfate (IRON) 325 (65 FE) MG TABS Take 1 tablet by mouth every evening.  . Fluticasone Furoate-Vilanterol (BREO ELLIPTA IN) Inhale 1 puff into the lungs daily.  . furosemide (LASIX) 40  MG tablet Take by mouth.  . loratadine (CLARITIN) 10 MG tablet Take by mouth.  . losartan (COZAAR) 100 MG tablet Take by mouth.  Marland Kitchen omeprazole (PRILOSEC) 40 MG capsule Take 40 mg by mouth.  . pantoprazole (PROTONIX) 40 MG tablet Take 40 mg by mouth every morning.  . potassium chloride (K-DUR) 10 MEQ tablet Take by mouth.     Allergies:   Levofloxacin   Social History   Socioeconomic History  . Marital status: Widowed    Spouse name: Not on file  . Number of children: Not on file  . Years of education: Not on file  . Highest education level: Not on file  Occupational History  . Not on file  Social Needs  . Financial resource strain: Not on file  . Food insecurity:    Worry: Not on file    Inability: Not on file  . Transportation needs:    Medical: Not on file    Non-medical: Not on file  Tobacco Use  . Smoking status: Never Smoker  . Smokeless tobacco: Never Used  Substance and Sexual Activity  . Alcohol use: No  . Drug use: No  . Sexual activity: Not on file  Lifestyle  . Physical activity:    Days per week: Not on file    Minutes per session: Not on file  . Stress: Not on file  Relationships  . Social connections:    Talks on phone: Not on file    Gets together: Not on  file    Attends religious service: Not on file    Active member of club or organization: Not on file    Attends meetings of clubs or organizations: Not on file    Relationship status: Not on file  Other Topics Concern  . Not on file  Social History Narrative  . Not on file     Family History: The patient's family history includes Asthma in her mother; Cancer in her mother; Diabetes in her mother; Heart disease in her father. ROS:   Please see the history of present illness.    All 14 point review of systems negative except as described per history of present illness  EKGs/Labs/Other Studies Reviewed:      Recent Labs: No results found for requested labs within last 8760 hours.  Recent  Lipid Panel No results found for: CHOL, TRIG, HDL, CHOLHDL, VLDL, LDLCALC, LDLDIRECT  Physical Exam:    VS:  BP 140/64   Pulse 77   Ht 5\' 6"  (1.676 m)   Wt 165 lb 12.8 oz (75.2 kg)   SpO2 97%   BMI 26.76 kg/m     Wt Readings from Last 3 Encounters:  11/22/17 165 lb 12.8 oz (75.2 kg)  05/30/17 165 lb (74.8 kg)  07/31/11 175 lb (79.4 kg)     GEN:  Well nourished, well developed in no acute distress HEENT: Normal NECK: No JVD; No carotid bruits LYMPHATICS: No lymphadenopathy CARDIAC: RRR, no murmurs, no rubs, no gallops RESPIRATORY: Rales and rhonchi on the right side, otherwise few wheezes. ABDOMEN: Soft, non-tender, non-distended MUSCULOSKELETAL:  No edema; No deformity  SKIN: Warm and dry LOWER EXTREMITIES: 2+ swelling NEUROLOGIC:  Alert and oriented x 3 PSYCHIATRIC:  Normal affect   ASSESSMENT:    No diagnosis found. PLAN:    In order of problems listed above:  1. Swelling of lower extremities.  She is on diuretic with some potassium I will check proBNP as well as Chem-7 to make sure it safe for [place] to Augmentin therapy.  DVT study done in May was negative. 2. Evidence of right-sided congestive heart failure by swelling of lower extremities, large jugular vein, also she does have minimal hepatomegaly.  I will ask her to have repeated echocardiogram I will looking at the right ventricle size and function also pulmonary artery pressure need to be assessed.  On top of that she does have history of pericardial effusion that need to be reassessed signs and symptoms that she presented with could be related to pulmonary hypertension as well as to pericardial effusion therefore dose to condition need to be clarified.  She may require a right-sided cardiac catheterization in the future. 3. Essential hypertension blood pressure appears to be well controlled. 4. COPD treated by internal medicine team with antibiotic the second course she is taking she was also given some  bronchodilators with good response.   Medication Adjustments/Labs and Tests Ordered: Current medicines are reviewed at length with the patient today.  Concerns regarding medicines are outlined above.  No orders of the defined types were placed in this encounter.  Medication changes: No orders of the defined types were placed in this encounter.   Signed, Park Liter, MD, [facility] 11/22/2017 2:30 PM    [place]

## 2017-11-22 NOTE — Patient Instructions (Signed)
Medication Instructions:  Your physician recommends that you continue on your current medications as directed. Please refer to the Current Medication list given to you today.  If you need a refill on your cardiac medications before your next appointment, please call your pharmacy.   Lab work: Your physician recommends that you return for lab work today: BMP and Pro bnp  If you have labs (blood work) drawn today and your tests are completely normal, you will receive your results only by: Marland Kitchen MyChart Message (if you have MyChart) OR . A paper copy in the mail If you have any lab test that is abnormal or we need to change your treatment, we will call you to review the results.  Testing/Procedures: Your physician has requested that you have an echocardiogram. Echocardiography is a painless test that uses sound waves to create images of your heart. It provides your doctor with information about the size and shape of your heart and how well your heart's chambers and valves are working. This procedure takes approximately one hour. There are no restrictions for this procedure.    Follow-Up: At [facility], you and your health needs are our priority.  As part of our continuing mission to provide you with exceptional heart care, we have created designated Provider Care Teams.  These Care Teams include your primary Cardiologist (physician) and Advanced Practice Providers (APPs -  Physician Assistants and Nurse Practitioners) who all work together to provide you with the care you need, when you need it. You will need a follow up appointment in 3 weeks.  Please call our office 2 months in advance to schedule this appointment.  You may see No primary care provider on file. or another member of our [employer] Provider Team in [place]: Shirlee More, MD . Jyl Heinz, MD  Any Other Special Instructions Will Be Listed Below (If Applicable).    Echocardiogram An echocardiogram, or  echocardiography, uses sound waves (ultrasound) to produce an image of your heart. The echocardiogram is simple, painless, obtained within a short period of time, and offers valuable information to your health care provider. The images from an echocardiogram can provide information such as:  Evidence of coronary artery disease (CAD).  Heart size.  Heart muscle function.  Heart valve function.  Aneurysm detection.  Evidence of a past heart attack.  Fluid buildup around the heart.  Heart muscle thickening.  Assess heart valve function.  Tell a health care provider about:  Any allergies you have.  All medicines you are taking, including vitamins, herbs, eye drops, creams, and over-the-counter medicines.  Any problems you or family members have had with anesthetic medicines.  Any blood disorders you have.  Any surgeries you have had.  Any medical conditions you have.  Whether you are pregnant or may be pregnant. What happens before the procedure? No special preparation is needed. Eat and drink normally. What happens during the procedure?  In order to produce an image of your heart, gel will be applied to your chest and a wand-like tool (transducer) will be moved over your chest. The gel will help transmit the sound waves from the transducer. The sound waves will harmlessly bounce off your heart to allow the heart images to be captured in real-time motion. These images will then be recorded.  You may need an IV to receive a medicine that improves the quality of the pictures. What happens after the procedure? You may return to your normal schedule including diet, activities, and medicines,  unless your health care provider tells you otherwise. This information is not intended to replace advice given to you by your health care provider. Make sure you discuss any questions you have with your health care provider. Document Released: 01/27/2000 Document Revised: 09/17/2015 Document  Reviewed: 10/06/2012 Elsevier Interactive Patient Education  2017 [employer].

## 2017-11-23 LAB — PRO B NATRIURETIC PEPTIDE: NT-Pro BNP: 236 pg/mL (ref 0–738)

## 2017-11-23 LAB — ECHOCARDIOGRAM COMPLETE
Height: 66 in
Weight: 2652.8 oz

## 2017-11-23 LAB — BASIC METABOLIC PANEL
BUN/Creatinine Ratio: 21 (ref 12–28)
BUN: 15 mg/dL (ref 8–27)
CO2: 26 mmol/L (ref 20–29)
CREATININE: 0.7 mg/dL (ref 0.57–1.00)
Calcium: 9.1 mg/dL (ref 8.7–10.3)
Chloride: 102 mmol/L (ref 96–106)
GFR, EST AFRICAN AMERICAN: 96 mL/min/{1.73_m2} (ref >59)
GFR, EST NON AFRICAN AMERICAN: 83 mL/min/{1.73_m2} (ref >59)
Glucose: 122 mg/dL — ABNORMAL HIGH (ref 65–99)
Potassium: 2.8 mmol/L — ABNORMAL LOW (ref 3.5–5.2)
SODIUM: 143 mmol/L (ref 134–144)

## 2017-11-27 ENCOUNTER — Telehealth: Payer: Self-pay | Admitting: Emergency Medicine

## 2017-11-27 DIAGNOSIS — I10 Essential (primary) hypertension: Secondary | ICD-10-CM

## 2017-11-27 MED ORDER — POTASSIUM CHLORIDE CRYS ER 20 MEQ PO TBCR: 40.0000 meq | EXTENDED_RELEASE_TABLET | Freq: Every day | ORAL | 3 refills | Status: DC

## 2017-11-27 NOTE — Telephone Encounter (Signed)
Patient informed of lab results. Informed to take potassium 40 meq daily starting today per Dr. Agustin Cree and she will have labs drawn tomorrow. Patient verbally understands.

## 2017-11-28 NOTE — Telephone Encounter (Signed)
Confirmed patient had lab work drawn today.

## 2017-11-29 ENCOUNTER — Telehealth: Payer: Self-pay | Admitting: Cardiology

## 2017-11-29 DIAGNOSIS — I10 Essential (primary) hypertension: Secondary | ICD-10-CM

## 2017-11-29 LAB — BASIC METABOLIC PANEL
BUN / CREAT RATIO: 24 (ref 12–28)
BUN: 18 mg/dL (ref 8–27)
CALCIUM: 8.4 mg/dL — AB (ref 8.7–10.3)
CO2: 26 mmol/L (ref 20–29)
CREATININE: 0.75 mg/dL (ref 0.57–1.00)
Chloride: 101 mmol/L (ref 96–106)
GFR calc Af Amer: 88 mL/min/{1.73_m2} (ref >59)
GFR calc non Af Amer: 77 mL/min/{1.73_m2} (ref >59)
GLUCOSE: 89 mg/dL (ref 65–99)
Potassium: 2.9 mmol/L — ABNORMAL LOW (ref 3.5–5.2)
Sodium: 143 mmol/L (ref 134–144)

## 2017-11-29 MED ORDER — POTASSIUM CHLORIDE CRYS ER 20 MEQ PO TBCR: 80.0000 meq | EXTENDED_RELEASE_TABLET | Freq: Every day | ORAL | 3 refills | Status: DC

## 2017-11-29 NOTE — Telephone Encounter (Addendum)
Patient informed of lab results and to have labs redrawn on Monday and increase potassium to 80 meq daily.

## 2017-11-29 NOTE — Telephone Encounter (Signed)
Please call patient with lab results

## 2017-12-03 ENCOUNTER — Telehealth: Payer: Self-pay | Admitting: Cardiology

## 2017-12-03 DIAGNOSIS — I10 Essential (primary) hypertension: Secondary | ICD-10-CM

## 2017-12-03 LAB — BASIC METABOLIC PANEL
BUN / CREAT RATIO: 19 (ref 12–28)
BUN: 15 mg/dL (ref 8–27)
CHLORIDE: 106 mmol/L (ref 96–106)
CO2: 21 mmol/L (ref 20–29)
Calcium: 8.7 mg/dL (ref 8.7–10.3)
Creatinine, Ser: 0.77 mg/dL (ref 0.57–1.00)
GFR calc non Af Amer: 74 mL/min/{1.73_m2} (ref >59)
GFR, EST AFRICAN AMERICAN: 86 mL/min/{1.73_m2} (ref >59)
GLUCOSE: 112 mg/dL — AB (ref 65–99)
POTASSIUM: 4.4 mmol/L (ref 3.5–5.2)
SODIUM: 144 mmol/L (ref 134–144)

## 2017-12-03 NOTE — Telephone Encounter (Signed)
Results have not resulted yet, will call patient tomorrow.

## 2017-12-03 NOTE — Telephone Encounter (Signed)
Please call patient with results of labwork..  

## 2017-12-04 MED ORDER — POTASSIUM CHLORIDE CRYS ER 20 MEQ PO TBCR: 60.0000 meq | EXTENDED_RELEASE_TABLET | Freq: Every day | ORAL | 3 refills | Status: DC

## 2017-12-04 NOTE — Telephone Encounter (Signed)
Patient informed of lab results and addressed her my chart message as well while on the phone. Patient informed to take 60 meq daily of potassium and to have labs redrawn tomorrow. Patient verbally understands.

## 2017-12-04 NOTE — Addendum Note (Signed)
Addended by: Ashok Norris on: 12/04/2017 08:51 AM   Modules accepted: Orders

## 2017-12-06 ENCOUNTER — Ambulatory Visit (INDEPENDENT_AMBULATORY_CARE_PROVIDER_SITE_OTHER)
Admission: RE | Admit: 2017-12-06 | Discharge: 2017-12-06 | Disposition: A | Discharge status: Home / Self Care | Payer: MEDICARE | Source: Ambulatory Visit | Attending: Cardiology | Admitting: Cardiology

## 2017-12-06 DIAGNOSIS — R0609 Other forms of dyspnea: Secondary | ICD-10-CM | POA: Diagnosis not present

## 2017-12-06 LAB — BASIC METABOLIC PANEL
BUN/Creatinine Ratio: 26 (ref 12–28)
BUN: 20 mg/dL (ref 8–27)
CO2: 23 mmol/L (ref 20–29)
Calcium: 9.2 mg/dL (ref 8.7–10.3)
Chloride: 106 mmol/L (ref 96–106)
Creatinine, Ser: 0.76 mg/dL (ref 0.57–1.00)
GFR calc Af Amer: 87 mL/min/{1.73_m2} (ref >59)
GFR calc non Af Amer: 75 mL/min/{1.73_m2} (ref >59)
GLUCOSE: 105 mg/dL — AB (ref 65–99)
Potassium: 5 mmol/L (ref 3.5–5.2)
Sodium: 144 mmol/L (ref 134–144)

## 2017-12-06 MED ORDER — IOPAMIDOL (ISOVUE-300) INJECTION 61%
75.0000 mL | Freq: Once | INTRAVENOUS | Status: AC | PRN
  Administered 2017-12-06: 75 mL via INTRAVENOUS

## 2017-12-10 ENCOUNTER — Telehealth: Payer: Self-pay | Admitting: Cardiology

## 2017-12-10 DIAGNOSIS — E875 Hyperkalemia: Secondary | ICD-10-CM

## 2017-12-10 NOTE — Telephone Encounter (Signed)
Patient informed of lab results. She was advised to have labs drawn again as soon as she can. Patient verbally understands.

## 2017-12-10 NOTE — Telephone Encounter (Signed)
Patient is asking for results of testing labs and echo

## 2017-12-16 ENCOUNTER — Ambulatory Visit (INDEPENDENT_AMBULATORY_CARE_PROVIDER_SITE_OTHER): Payer: MEDICARE | Admitting: Cardiology

## 2017-12-16 ENCOUNTER — Encounter: Payer: Self-pay | Admitting: Cardiology

## 2017-12-16 ENCOUNTER — Telehealth: Payer: Self-pay | Admitting: Emergency Medicine

## 2017-12-16 VITALS — BP 150/64 | HR 94 | Ht 66.0 in | Wt 164.4 lb

## 2017-12-16 DIAGNOSIS — R600 Localized edema: Secondary | ICD-10-CM

## 2017-12-16 DIAGNOSIS — I10 Essential (primary) hypertension: Secondary | ICD-10-CM | POA: Diagnosis not present

## 2017-12-16 DIAGNOSIS — E782 Mixed hyperlipidemia: Secondary | ICD-10-CM | POA: Diagnosis not present

## 2017-12-16 DIAGNOSIS — R0609 Other forms of dyspnea: Secondary | ICD-10-CM

## 2017-12-16 NOTE — Progress Notes (Signed)
Cardiology Office Note:    Date:  12/16/2017   ID:  Shannon Villegas, DOB 03/14/1940, MRN [id number]  PCP:  Elisabeth Cara, PA-C  Cardiologist:  Jenne Campus, MD    Referring MD: Elisabeth Cara, *   Chief Complaint  Patient presents with  . Follow-up  Still short of breath but overall seems to be doing slightly better  History of Present Illness:    Shannon Villegas is a 77 y.o. female with clinical evidence of cor pulmonale.  She does have JVD hepatomegaly also swelling of lower extremities echocardiogram was done which showed preserved left ventricular ejection fraction.  No evidence of tamponade.  CT of her chest was done after that which showed bilateral pleural effusion some loss of volume in her lungs.  Bronchiectasis.  But otherwise no clear-cut explanation of her symptoms.  We had a long discussion about what to do with the situation.  I told her to start her furosemide on the regular basis she takes it only twice a week because she is afraid she cannot pee a lot when she leaves her home.  I asked him to increase the dose of furosemide to 60 mg every day.  She will take it for at least 10 days.  I asked her not to take potassium since since she was taking potassium without taking furosemide.  Today she had Chem-7 drawn based on that we decide how much potassium she need to be on.  I see her back in the office in about 2 weeks to see how she does.  She may require a right-sided cardiac catheterization in the future.  Past Medical History:  Diagnosis Date  . Arthritis KNEES AND FEET  . GERD (gastroesophageal reflux disease)   . H/O hiatal hernia   . History of urinary stone BLADDER STONE EXTRACTIN IN OFFICE JUNE 2013  . Lesion of bladder     Past Surgical History:  Procedure Laterality Date  . CYSTOSCOPY WITH BIOPSY  08/06/2011   Procedure: CYSTOSCOPY WITH BIOPSY;  Surgeon: Claybon Jabs, MD;  Location[facility];  Service: Urology;   Laterality: N/A;  30 mins requested for this case  BLADDER BIOPSY  CAMERA GYRUS  . PARTIAL COLECTOMY  2009   RESECTION POLYPS (BENIGN)    Current Medications: Current Meds  Medication Sig  . Ferrous Sulfate (IRON) 325 (65 FE) MG TABS Take 1 tablet by mouth every other day.   . Fluticasone Furoate-Vilanterol (BREO ELLIPTA IN) Inhale 1 puff into the lungs daily.  . furosemide (LASIX) 40 MG tablet Take 40 mg by mouth as needed.   . loratadine (CLARITIN) 10 MG tablet Take 10 mg by mouth daily.   Marland Kitchen losartan (COZAAR) 100 MG tablet Take 100 mg by mouth daily.   Marland Kitchen omeprazole (PRILOSEC) 40 MG capsule Take 40 mg by mouth.  . pantoprazole (PROTONIX) 40 MG tablet Take 40 mg by mouth every morning.  . potassium chloride SA (KLOR-CON M20) 20 MEQ tablet Take 3 tablets (60 mEq total) by mouth daily.     Allergies:   Levofloxacin   Social History   Socioeconomic History  . Marital status: Widowed    Spouse name: Not on file  . Number of children: Not on file  . Years of education: Not on file  . Highest education level: Not on file  Occupational History  . Not on file  Social Needs  . Financial resource strain: Not on file  . Food insecurity:  Worry: Not on file    Inability: Not on file  . Transportation needs:    Medical: Not on file    Non-medical: Not on file  Tobacco Use  . Smoking status: Never Smoker  . Smokeless tobacco: Never Used  Substance and Sexual Activity  . Alcohol use: No  . Drug use: No  . Sexual activity: Not on file  Lifestyle  . Physical activity:    Days per week: Not on file    Minutes per session: Not on file  . Stress: Not on file  Relationships  . Social connections:    Talks on phone: Not on file    Gets together: Not on file    Attends religious service: Not on file    Active member of club or organization: Not on file    Attends meetings of clubs or organizations: Not on file    Relationship status: Not on file  Other Topics Concern  . Not  on file  Social History Narrative  . Not on file     Family History: The patient's family history includes Asthma in her mother; Cancer in her mother; Diabetes in her mother; Heart disease in her father. ROS:   Please see the history of present illness.    All 14 point review of systems negative except as described per history of present illness  EKGs/Labs/Other Studies Reviewed:      Recent Labs: 11/22/2017: NT-Pro BNP 236 12/05/2017: BUN 20; Creatinine, Ser 0.76; Potassium 5.0; Sodium 144  Recent Lipid Panel No results found for: CHOL, TRIG, HDL, CHOLHDL, VLDL, LDLCALC, LDLDIRECT  Physical Exam:    VS:  BP (!) 150/64   Pulse 94   Ht 5\' 6"  (1.676 m)   Wt 164 lb 6.4 oz (74.6 kg)   SpO2 94%   BMI 26.53 kg/m     Wt Readings from Last 3 Encounters:  12/16/17 164 lb 6.4 oz (74.6 kg)  11/22/17 165 lb 12.8 oz (75.2 kg)  05/30/17 165 lb (74.8 kg)     GEN:  Well nourished, well developed in no acute distress HEENT: Normal NECK: No JVD; No carotid bruits LYMPHATICS: No lymphadenopathy CARDIAC: RRR, no murmurs, no rubs, no gallops RESPIRATORY:  Clear to auscultation without rales, wheezing or rhonchi poor air entry both bases. ABDOMEN: Soft, non-tender, non-distended MUSCULOSKELETAL:  No edema; No deformity  SKIN: Warm and dry LOWER EXTREMITIES: 1+ swelling NEUROLOGIC:  Alert and oriented x 3 PSYCHIATRIC:  Normal affect   ASSESSMENT:    1. Dyspnea on exertion   2. Pedal edema   3. Mixed hyperlipidemia   4. Essential hypertension    PLAN:    In order of problems listed above:  1. Dyspnea on exertion plan as outlined above we will initiate diuresis.  Previously she did not take furosemide now hopefully she will comply with this and will make her feel better. 2. Pedal edema will increase diuresis. 3. Mixed dyslipidemia on appropriate medications I will continue. 4. Essential hypertension should improve with addition of diuretic.   Medication Adjustments/Labs and  Tests Ordered: Current medicines are reviewed at length with the patient today.  Concerns regarding medicines are outlined above.  No orders of the defined types were placed in this encounter.  Medication changes: No orders of the defined types were placed in this encounter.   Signed, Park Liter, MD, [facility] 12/16/2017 4:08 PM    [facility]

## 2017-12-16 NOTE — Patient Instructions (Signed)
Medication Instructions:  Your physician has recommended you make the following change in your medication:   INCREASE: Furosemide to 60 mg daily   STOP: Potassium until we call with results from lab work.   If you need a refill on your cardiac medications before your next appointment, please call your pharmacy.   Lab work: None.   If you have labs (blood work) drawn today and your tests are completely normal, you will receive your results only by: Marland Kitchen MyChart Message (if you have MyChart) OR . A paper copy in the mail If you have any lab test that is abnormal or we need to change your treatment, we will call you to review the results.  Testing/Procedures: None.  Follow-Up: At [facility], you and your health needs are our priority.  As part of our continuing mission to provide you with exceptional heart care, we have created designated Provider Care Teams.  These Care Teams include your primary Cardiologist (physician) and Advanced Practice Providers (APPs -  Physician Assistants and Nurse Practitioners) who all work together to provide you with the care you need, when you need it. You will need a follow up appointment in 2 weeks.  Please call our office 2 months in advance to schedule this appointment.  You may see No primary care provider on file. or another member of our [employer] Provider Team in [place]: Shirlee More, MD . Jyl Heinz, MD  Any Other Special Instructions Will Be Listed Below (If Applicable).

## 2017-12-16 NOTE — Telephone Encounter (Signed)
Patient left office before paperwork was given to her. Called daughter and informed her to have patient increase furosemide to 60 mg daily and to hold potassium until we get lab results back. Patient is to return in 2 weeks. Patient's daughter verbally understands.

## 2017-12-17 ENCOUNTER — Telehealth: Payer: Self-pay | Admitting: *Deleted

## 2017-12-17 DIAGNOSIS — I10 Essential (primary) hypertension: Secondary | ICD-10-CM

## 2017-12-17 LAB — BASIC METABOLIC PANEL
BUN / CREAT RATIO: 24 (ref 12–28)
BUN: 20 mg/dL (ref 8–27)
CHLORIDE: 110 mmol/L — AB (ref 96–106)
CO2: 19 mmol/L — ABNORMAL LOW (ref 20–29)
Calcium: 8.5 mg/dL — ABNORMAL LOW (ref 8.7–10.3)
Creatinine, Ser: 0.83 mg/dL (ref 0.57–1.00)
GFR calc Af Amer: 78 mL/min/{1.73_m2} (ref >59)
GFR calc non Af Amer: 68 mL/min/{1.73_m2} (ref >59)
GLUCOSE: 121 mg/dL — AB (ref 65–99)
Potassium: 4.6 mmol/L (ref 3.5–5.2)
SODIUM: 140 mmol/L (ref 134–144)

## 2017-12-17 MED ORDER — POTASSIUM CHLORIDE CRYS ER 20 MEQ PO TBCR: 60.0000 meq | EXTENDED_RELEASE_TABLET | Freq: Every day | ORAL | 3 refills | Status: DC

## 2017-12-17 NOTE — Telephone Encounter (Signed)
Patient informed to have labs drawn on Thursday and to resume potassium 60 meq daily. Patient verbally understands

## 2017-12-17 NOTE — Telephone Encounter (Signed)
Pt wants to know results of blood work done yesterday so can adjust meds.

## 2017-12-21 LAB — BASIC METABOLIC PANEL
BUN / CREAT RATIO: 31 — AB (ref 12–28)
BUN: 31 mg/dL — ABNORMAL HIGH (ref 8–27)
CO2: 18 mmol/L — ABNORMAL LOW (ref 20–29)
CREATININE: 1 mg/dL (ref 0.57–1.00)
Calcium: 8.8 mg/dL (ref 8.7–10.3)
Chloride: 108 mmol/L — ABNORMAL HIGH (ref 96–106)
GFR calc Af Amer: 62 mL/min/{1.73_m2} (ref >59)
GFR, EST NON AFRICAN AMERICAN: 54 mL/min/{1.73_m2} — AB (ref >59)
Glucose: 70 mg/dL (ref 65–99)
Potassium: 4.6 mmol/L (ref 3.5–5.2)
SODIUM: 143 mmol/L (ref 134–144)

## 2018-01-08 ENCOUNTER — Encounter: Payer: Self-pay | Admitting: Cardiology

## 2018-01-08 ENCOUNTER — Ambulatory Visit (INDEPENDENT_AMBULATORY_CARE_PROVIDER_SITE_OTHER): Payer: MEDICARE | Admitting: Cardiology

## 2018-01-08 VITALS — BP 110/64 | HR 72 | Ht 66.0 in | Wt 154.8 lb

## 2018-01-08 DIAGNOSIS — I10 Essential (primary) hypertension: Secondary | ICD-10-CM | POA: Diagnosis not present

## 2018-01-08 DIAGNOSIS — R600 Localized edema: Secondary | ICD-10-CM

## 2018-01-08 DIAGNOSIS — I509 Heart failure, unspecified: Secondary | ICD-10-CM | POA: Insufficient documentation

## 2018-01-08 DIAGNOSIS — I50812 Chronic right heart failure: Secondary | ICD-10-CM

## 2018-01-08 NOTE — Patient Instructions (Signed)
Medication Instructions:  Your physician recommends that you continue on your current medications as directed. Please refer to the Current Medication list given to you today.  If you need a refill on your cardiac medications before your next appointment, please call your pharmacy.   Lab work: Your physician recommends that you return for lab work today: BMP, BNP  If you have labs (blood work) drawn today and your tests are completely normal, you will receive your results only by: Marland Kitchen MyChart Message (if you have MyChart) OR . A paper copy in the mail If you have any lab test that is abnormal or we need to change your treatment, we will call you to review the results.  Testing/Procedures: None.   Follow-Up: At [facility], you and your health needs are our priority.  As part of our continuing mission to provide you with exceptional heart care, we have created designated Provider Care Teams.  These Care Teams include your primary Cardiologist (physician) and Advanced Practice Providers (APPs -  Physician Assistants and Nurse Practitioners) who all work together to provide you with the care you need, when you need it. .  You will need a follow up appointment in 1 months.  Please call our office 2 months in advance to schedule this appointment.  You may see No primary care provider on file. or another member of our [employer] Provider Team in [place]: Shirlee More, MD . Jyl Heinz, MD  Any Other Special Instructions Will Be Listed Below (If Applicable).

## 2018-01-08 NOTE — Progress Notes (Signed)
Cardiology Office Note:    Date:  01/08/2018   ID:  Shannon Villegas, DOB 04/26/1940, MRN [id number]  PCP:  Elisabeth Cara, PA-C  Cardiologist:  Jenne Campus, MD    Referring MD: Elisabeth Cara, *   Chief Complaint  Patient presents with  . 2 Week Follow-up  Doing much better  History of Present Illness:    Shannon Villegas is a 77 y.o. female with clinical evidence of right-sided heart failure.  She did have echocardiogram which did not show significant pulmonary hypertension there was no significant pericardial effusion.  Her chest x-ray revealed bilateral pleural effusion.  She was started on diuresis however initially did not comply with this now since I seen her last time 2 weeks ago she is been taking 60 mg furosemide with 60 mg of potassium every day, she lost 10 pounds since I seen her last time and feels dramatically better shortness of breath improved she can sleep well that some swelling of lower extremities but overall better I will ask her to have Chem-7 as well as proBNP done and based on that we will decide if we can augment her therapy somewhat.  If her Chem-7 is acceptable then I will go to 80 mg furosemide and new present management.  Past Medical History:  Diagnosis Date  . Arthritis KNEES AND FEET  . GERD (gastroesophageal reflux disease)   . H/O hiatal hernia   . History of urinary stone BLADDER STONE EXTRACTIN IN OFFICE JUNE 2013  . Lesion of bladder     Past Surgical History:  Procedure Laterality Date  . CYSTOSCOPY WITH BIOPSY  08/06/2011   Procedure: CYSTOSCOPY WITH BIOPSY;  Surgeon: Claybon Jabs, MD;  Location: [facility];  Service: Urology;  Laterality: N/A;  30 mins requested for this case  BLADDER BIOPSY  CAMERA GYRUS  . PARTIAL COLECTOMY  2009   RESECTION POLYPS (BENIGN)    Current Medications: Current Meds  Medication Sig  . Ferrous Sulfate (IRON) 325 (65 FE) MG TABS Take 1 tablet by mouth every other  day.   . Fluticasone Furoate-Vilanterol (BREO ELLIPTA IN) Inhale 1 puff into the lungs daily.  . furosemide (LASIX) 40 MG tablet Take 60 mg by mouth daily.  Marland Kitchen loratadine (CLARITIN) 10 MG tablet Take 10 mg by mouth daily.   Marland Kitchen losartan (COZAAR) 100 MG tablet Take 100 mg by mouth daily.   Marland Kitchen omeprazole (PRILOSEC) 40 MG capsule Take 40 mg by mouth.  . pantoprazole (PROTONIX) 40 MG tablet Take 40 mg by mouth every morning.  . potassium chloride SA (KLOR-CON M20) 20 MEQ tablet Take 3 tablets (60 mEq total) by mouth daily.     Allergies:   Levofloxacin   Social History   Socioeconomic History  . Marital status: Widowed    Spouse name: Not on file  . Number of children: Not on file  . Years of education: Not on file  . Highest education level: Not on file  Occupational History  . Not on file  Social Needs  . Financial resource strain: Not on file  . Food insecurity:    Worry: Not on file    Inability: Not on file  . Transportation needs:    Medical: Not on file    Non-medical: Not on file  Tobacco Use  . Smoking status: Never Smoker  . Smokeless tobacco: Never Used  Substance and Sexual Activity  . Alcohol use: No  . Drug use: No  .  Sexual activity: Not on file  Lifestyle  . Physical activity:    Days per week: Not on file    Minutes per session: Not on file  . Stress: Not on file  Relationships  . Social connections:    Talks on phone: Not on file    Gets together: Not on file    Attends religious service: Not on file    Active member of club or organization: Not on file    Attends meetings of clubs or organizations: Not on file    Relationship status: Not on file  Other Topics Concern  . Not on file  Social History Narrative  . Not on file     Family History: The patient's family history includes Asthma in her mother; Cancer in her mother; Diabetes in her mother; Heart disease in her father. ROS:   Please see the history of present illness.    All 14 point review  of systems negative except as described per history of present illness  EKGs/Labs/Other Studies Reviewed:      Recent Labs: 11/22/2017: NT-Pro BNP 236 12/20/2017: BUN 31; Creatinine, Ser 1.00; Potassium 4.6; Sodium 143  Recent Lipid Panel No results found for: CHOL, TRIG, HDL, CHOLHDL, VLDL, LDLCALC, LDLDIRECT  Physical Exam:    VS:  BP 110/64   Pulse 72   Ht 5\' 6"  (1.676 m)   Wt 154 lb 12.8 oz (70.2 kg)   SpO2 97%   BMI 24.99 kg/m     Wt Readings from Last 3 Encounters:  01/08/18 154 lb 12.8 oz (70.2 kg)  12/16/17 164 lb 6.4 oz (74.6 kg)  11/22/17 165 lb 12.8 oz (75.2 kg)     GEN:  Well nourished, well developed in no acute distress HEENT: Normal NECK: No JVD; No carotid bruits LYMPHATICS: No lymphadenopathy CARDIAC: RRR, no murmurs, no rubs, no gallops RESPIRATORY:  Clear to auscultation without rales, wheezing or rhonchi  ABDOMEN: Soft, non-tender, non-distended MUSCULOSKELETAL:  No edema; No deformity  SKIN: Warm and dry LOWER EXTREMITIES: 1+ NEUROLOGIC:  Alert and oriented x 3 PSYCHIATRIC:  Normal affect   ASSESSMENT:    No diagnosis found. PLAN:    In order of problems listed above:  1. Congestive heart failure, right-sided.  Overall clinically improving still some swelling of lower extremities but shortness of breath improved dramatically her JVD is flat today still no hepatojugular reflux.  No clear-cut explanation for this phenomenon does not have significant pericardial effusion.  Does have some diastolic dysfunction.  Overall aggressive diuresis has been started she is doing much better plan as outlined above which include checking Chem-7 to see if we can increase the dose of diuretic to 80 mg daily.  We were talking about potentially doing right side cardiac catheterization to clarify diagnosis however she is reluctant to consider this overall she feels better. 2. COPD stable followed by internal medicine team. 3. Essential hypertension blood pressure well  controlled continue present management. 4. Bilateral pleural effusion overall doing well clinically improving.   Medication Adjustments/Labs and Tests Ordered: Current medicines are reviewed at length with the patient today.  Concerns regarding medicines are outlined above.  No orders of the defined types were placed in this encounter.  Medication changes: No orders of the defined types were placed in this encounter.   Signed, Park Liter, MD, [facility] 01/08/2018 10:29 AM    [place]

## 2018-01-09 LAB — BASIC METABOLIC PANEL
BUN/Creatinine Ratio: 33 — ABNORMAL HIGH (ref 12–28)
BUN: 48 mg/dL — ABNORMAL HIGH (ref 8–27)
CHLORIDE: 107 mmol/L — AB (ref 96–106)
CO2: 18 mmol/L — AB (ref 20–29)
Calcium: 8.7 mg/dL (ref 8.7–10.3)
Creatinine, Ser: 1.45 mg/dL — ABNORMAL HIGH (ref 0.57–1.00)
GFR, EST AFRICAN AMERICAN: 40 mL/min/{1.73_m2} — AB (ref >59)
GFR, EST NON AFRICAN AMERICAN: 34 mL/min/{1.73_m2} — AB (ref >59)
Glucose: 91 mg/dL (ref 65–99)
POTASSIUM: 4.6 mmol/L (ref 3.5–5.2)
SODIUM: 142 mmol/L (ref 134–144)

## 2018-01-09 LAB — PRO B NATRIURETIC PEPTIDE: NT-Pro BNP: 106 pg/mL (ref 0–738)

## 2018-01-13 ENCOUNTER — Telehealth: Payer: Self-pay | Admitting: Emergency Medicine

## 2018-01-13 DIAGNOSIS — I10 Essential (primary) hypertension: Secondary | ICD-10-CM

## 2018-01-13 NOTE — Telephone Encounter (Signed)
Patient informed of lab result and to have labs rechecked in 10 days. She verbally understands.

## 2018-01-24 LAB — BASIC METABOLIC PANEL
BUN/Creatinine Ratio: 24 (ref 12–28)
BUN: 25 mg/dL (ref 8–27)
CALCIUM: 8.6 mg/dL — AB (ref 8.7–10.3)
CO2: 22 mmol/L (ref 20–29)
CREATININE: 1.04 mg/dL — AB (ref 0.57–1.00)
Chloride: 110 mmol/L — ABNORMAL HIGH (ref 96–106)
GFR calc Af Amer: 59 mL/min/{1.73_m2} — ABNORMAL LOW (ref >59)
GFR, EST NON AFRICAN AMERICAN: 51 mL/min/{1.73_m2} — AB (ref >59)
Glucose: 101 mg/dL — ABNORMAL HIGH (ref 65–99)
Potassium: 4.3 mmol/L (ref 3.5–5.2)
Sodium: 143 mmol/L (ref 134–144)

## 2018-01-31 ENCOUNTER — Ambulatory Visit (HOSPITAL_BASED_OUTPATIENT_CLINIC_OR_DEPARTMENT_OTHER)
Admission: RE | Admit: 2018-01-31 | Discharge: 2018-01-31 | Disposition: A | Discharge status: Home / Self Care | Payer: MEDICARE | Source: Ambulatory Visit | Attending: Cardiology | Admitting: Cardiology

## 2018-01-31 ENCOUNTER — Encounter: Payer: Self-pay | Admitting: Cardiology

## 2018-01-31 ENCOUNTER — Ambulatory Visit (INDEPENDENT_AMBULATORY_CARE_PROVIDER_SITE_OTHER): Payer: MEDICARE | Admitting: Cardiology

## 2018-01-31 VITALS — BP 110/64 | HR 72 | Resp 12 | Ht 66.0 in | Wt 155.8 lb

## 2018-01-31 DIAGNOSIS — I50812 Chronic right heart failure: Secondary | ICD-10-CM | POA: Diagnosis not present

## 2018-01-31 DIAGNOSIS — I10 Essential (primary) hypertension: Secondary | ICD-10-CM | POA: Diagnosis not present

## 2018-01-31 DIAGNOSIS — R05 Cough: Secondary | ICD-10-CM | POA: Insufficient documentation

## 2018-01-31 DIAGNOSIS — R059 Cough, unspecified: Secondary | ICD-10-CM

## 2018-01-31 DIAGNOSIS — E782 Mixed hyperlipidemia: Secondary | ICD-10-CM

## 2018-01-31 DIAGNOSIS — R600 Localized edema: Secondary | ICD-10-CM | POA: Diagnosis not present

## 2018-01-31 NOTE — Progress Notes (Signed)
Cardiology Office Note:    Date:  01/31/2018   ID:  Shannon Villegas, DOB February 09, 1941, MRN [id number]  PCP:  Elisabeth Cara, PA-C  Cardiologist:  Jenne Campus, MD    Referring MD: Elisabeth Cara, *   Chief Complaint  Patient presents with  . 1 month follow up  Doing well  History of Present Illness:    Shannon Villegas is a 77 y.o. female with right-sided heart failure.  Successfully managed with diuretics seems to be doing well legs have only minimally swollen she does not have any shortness of breath she is very happy and satisfied with way she feels  Past Medical History:  Diagnosis Date  . Arthritis KNEES AND FEET  . GERD (gastroesophageal reflux disease)   . H/O hiatal hernia   . History of urinary stone BLADDER STONE EXTRACTIN IN OFFICE JUNE 2013  . Lesion of bladder     Past Surgical History:  Procedure Laterality Date  . CYSTOSCOPY WITH BIOPSY  08/06/2011   Procedure: CYSTOSCOPY WITH BIOPSY;  Surgeon: Claybon Jabs, MD;  Location: [facility];  Service: Urology;  Laterality: N/A;  30 mins requested for this case  BLADDER BIOPSY  CAMERA GYRUS  . PARTIAL COLECTOMY  2009   RESECTION POLYPS (BENIGN)    Current Medications: No outpatient medications have been marked as taking for the 01/31/18 encounter (Office Visit) with Park Liter, MD.     Allergies:   Levofloxacin   Social History   Socioeconomic History  . Marital status: Widowed    Spouse name: Not on file  . Number of children: Not on file  . Years of education: Not on file  . Highest education level: Not on file  Occupational History  . Not on file  Social Needs  . Financial resource strain: Not on file  . Food insecurity:    Worry: Not on file    Inability: Not on file  . Transportation needs:    Medical: Not on file    Non-medical: Not on file  Tobacco Use  . Smoking status: Never Smoker  . Smokeless tobacco: Never Used  Substance and Sexual  Activity  . Alcohol use: No  . Drug use: No  . Sexual activity: Not on file  Lifestyle  . Physical activity:    Days per week: Not on file    Minutes per session: Not on file  . Stress: Not on file  Relationships  . Social connections:    Talks on phone: Not on file    Gets together: Not on file    Attends religious service: Not on file    Active member of club or organization: Not on file    Attends meetings of clubs or organizations: Not on file    Relationship status: Not on file  Other Topics Concern  . Not on file  Social History Narrative  . Not on file     Family History: The patient's family history includes Asthma in her mother; Cancer in her mother; Diabetes in her mother; Heart disease in her father. ROS:   Please see the history of present illness.    All 14 point review of systems negative except as described per history of present illness  EKGs/Labs/Other Studies Reviewed:      Recent Labs: 01/08/2018: NT-Pro BNP 106 01/23/2018: BUN 25; Creatinine, Ser 1.04; Potassium 4.3; Sodium 143  Recent Lipid Panel No results found for: CHOL, TRIG, HDL, CHOLHDL, VLDL, LDLCALC, LDLDIRECT  Physical Exam:    VS:  BP 110/64   Pulse 72   Resp 12   Ht 5\' 6"  (1.676 m)   Wt 155 lb 12.8 oz (70.7 kg)   BMI 25.15 kg/m     Wt Readings from Last 3 Encounters:  01/31/18 155 lb 12.8 oz (70.7 kg)  01/08/18 154 lb 12.8 oz (70.2 kg)  12/16/17 164 lb 6.4 oz (74.6 kg)     GEN:  Well nourished, well developed in no acute distress HEENT: Normal NECK: No JVD; No carotid bruits LYMPHATICS: No lymphadenopathy CARDIAC: RRR, no murmurs, no rubs, no gallops RESPIRATORY:  Clear to auscultation without rales, wheezing or rhonchi  ABDOMEN: Soft, non-tender, non-distended MUSCULOSKELETAL:  No edema; No deformity  SKIN: Warm and dry LOWER EXTREMITIES: no swelling NEUROLOGIC:  Alert and oriented x 3 PSYCHIATRIC:  Normal affect   ASSESSMENT:    1. Cough   2. Chronic right-sided  congestive heart failure ([place])   3. Essential hypertension   4. Pedal edema   5. Mixed hyperlipidemia    PLAN:    In order of problems listed above:  1. Right-sided heart failure.  Appears to be compensated we will repeat chest x-ray today to see month of pleural effusion that she had before. 2. Essential hypertension blood pressure well controlled continue present management. 3. Pedal edema improved significantly. 4. Mixed dyslipidemia continue present management   Medication Adjustments/Labs and Tests Ordered: Current medicines are reviewed at length with the patient today.  Concerns regarding medicines are outlined above.  Orders Placed This Encounter  Procedures  . DG Chest 2 View   Medication changes: No orders of the defined types were placed in this encounter.   Signed, Park Liter, MD, [facility] 01/31/2018 11:25 AM    [place]

## 2018-01-31 NOTE — Patient Instructions (Signed)
Medication Instructions:  Your physician recommends that you continue on your current medications as directed. Please refer to the Current Medication list given to you today.  If you need a refill on your cardiac medications before your next appointment, please call your pharmacy.   Lab work: None.  If you have labs (blood work) drawn today and your tests are completely normal, you will receive your results only by: Marland Kitchen MyChart Message (if you have MyChart) OR . A paper copy in the mail If you have any lab test that is abnormal or we need to change your treatment, we will call you to review the results.  Testing/Procedures: A chest x-ray takes a picture of the organs and structures inside the chest, including the heart, lungs, and blood vessels. This test can show several things, including, whether the heart is enlarges; whether fluid is building up in the lungs; and whether pacemaker / defibrillator leads are still in place.   Follow-Up: At [facility], you and your health needs are our priority.  As part of our continuing mission to provide you with exceptional heart care, we have created designated Provider Care Teams.  These Care Teams include your primary Cardiologist (physician) and Advanced Practice Providers (APPs -  Physician Assistants and Nurse Practitioners) who all work together to provide you with the care you need, when you need it. You will need a follow up appointment in 2 months.  Please call our office 2 months in advance to schedule this appointment.  You may see No primary care provider on file. or another member of our [employer] Provider Team in [place]: Shirlee More, MD . Jyl Heinz, MD  Any Other Special Instructions Will Be Listed Below (If Applicable).   Chest X-Ray  A chest X-ray is a painless test that uses radiation to create images of the structures inside of your chest. Chest X-rays are used to look for many health conditions, including heart  failure, pneumonia, tuberculosis, rib fractures, breathing disorders, and cancer. They may be used to diagnose chest pain, constant coughing, or trouble breathing. Tell a health care provider about:  Any allergies you have.  All medicines you are taking, including vitamins, herbs, eye drops, creams, and over-the-counter medicines.  Any surgeries you have had.  Any medical conditions you have.  Whether you are pregnant or may be pregnant. What are the risks? Getting a chest X-ray is a safe procedure. However, you will be exposed to a small amount of radiation. Being exposed to too much radiation over a lifetime can increase the risk of cancer. This risk is small, but it may occur if you have many X-rays throughout your life. What happens before the procedure?  You may be asked to remove glasses, jewelry, and any other metal objects.  You will be asked to undress from the waist up. You may be given a hospital gown to wear.  You may be asked to wear a protective lead apron to protect parts of your body from radiation. What happens during the procedure?  You will be asked to stand still as each picture is taken to get the best possible images.  You will be asked to take a deep breath and hold your breath for a few seconds.  The X-ray machine will create a picture of your chest using a tiny burst of radiation. This is painless.  More pictures may be taken from other angles. Typically, one picture will be taken while you face the X-ray camera,  and another picture will be taken from the side while you stand. If you cannot stand, you may be asked to lie down. The procedure may vary among health care providers and hospitals. What happens after the procedure?  The X-ray(s) will be reviewed by your health care provider or an X-ray (radiology) specialist.  It is up to you to get your test results. Ask your health care provider, or the department that is doing the test, when your results will  be ready.  Your health care provider will tell you if you need more tests or a follow-up exam. Keep all follow-up visits as told by your health care provider. This is important. Summary  A chest X-ray is a safe, painless test that is used to examine the inside of the chest, heart, and lungs.  You will need to undress from the waist up and remove jewelry and metal objects before the procedure.  You will be exposed to a small amount of radiation during the procedure.  The X-ray machine will take one or more pictures of your chest while you remain as still as possible.  Later, a health care provider or specialist will review the test results with you. This information is not intended to replace advice given to you by your health care provider. Make sure you discuss any questions you have with your health care provider. Document Released: 03/27/2016 Document Revised: 03/27/2016 Document Reviewed: 03/27/2016 Elsevier Interactive Patient Education  [employer].

## 2018-03-28 ENCOUNTER — Other Ambulatory Visit: Payer: Self-pay

## 2018-03-28 DIAGNOSIS — R0609 Other forms of dyspnea: Secondary | ICD-10-CM

## 2018-03-28 DIAGNOSIS — I10 Essential (primary) hypertension: Secondary | ICD-10-CM

## 2018-03-28 DIAGNOSIS — E875 Hyperkalemia: Secondary | ICD-10-CM

## 2018-03-28 DIAGNOSIS — I50812 Chronic right heart failure: Secondary | ICD-10-CM

## 2018-03-29 LAB — BASIC METABOLIC PANEL
BUN/Creatinine Ratio: 27 (ref 12–28)
BUN: 26 mg/dL (ref 8–27)
CHLORIDE: 113 mmol/L — AB (ref 96–106)
CO2: 17 mmol/L — ABNORMAL LOW (ref 20–29)
Calcium: 8.6 mg/dL — ABNORMAL LOW (ref 8.7–10.3)
Creatinine, Ser: 0.95 mg/dL (ref 0.57–1.00)
GFR calc Af Amer: 66 mL/min/{1.73_m2} (ref >59)
GFR calc non Af Amer: 57 mL/min/{1.73_m2} — ABNORMAL LOW (ref >59)
Glucose: 84 mg/dL (ref 65–99)
Potassium: 4.5 mmol/L (ref 3.5–5.2)
Sodium: 145 mmol/L — ABNORMAL HIGH (ref 134–144)

## 2018-03-29 LAB — PRO B NATRIURETIC PEPTIDE: NT-Pro BNP: 253 pg/mL (ref 0–738)

## 2018-04-01 ENCOUNTER — Ambulatory Visit (INDEPENDENT_AMBULATORY_CARE_PROVIDER_SITE_OTHER): Payer: MEDICARE | Admitting: Cardiology

## 2018-04-01 VITALS — BP 120/66 | HR 58 | Ht 66.0 in | Wt 156.4 lb

## 2018-04-01 DIAGNOSIS — I50812 Chronic right heart failure: Secondary | ICD-10-CM

## 2018-04-01 DIAGNOSIS — I10 Essential (primary) hypertension: Secondary | ICD-10-CM | POA: Diagnosis not present

## 2018-04-01 DIAGNOSIS — R0609 Other forms of dyspnea: Secondary | ICD-10-CM

## 2018-04-01 DIAGNOSIS — E782 Mixed hyperlipidemia: Secondary | ICD-10-CM

## 2018-04-01 DIAGNOSIS — J410 Simple chronic bronchitis: Secondary | ICD-10-CM

## 2018-04-01 MED ORDER — FUROSEMIDE 80 MG PO TABS: ORAL_TABLET | ORAL | 1 refills | Status: DC

## 2018-04-01 NOTE — Progress Notes (Signed)
Cardiology Office Note:    Date:  04/01/2018   ID:  Shannon Villegas, DOB May 18, 1940, MRN [id number]  PCP:  Elisabeth Cara, PA-C  Cardiologist:  Jenne Campus, MD    Referring MD: Elisabeth Cara, *   Chief Complaint  Patient presents with  . 2 month follow up  Doing well  History of Present Illness:    Shannon Villegas is a 78 y.o. female right-sided heart failure.  She is doing much better denies having much shortness of breath does have some swelling of lower extremities which appears to be slightly worse than before.  No chest pain tightness squeezing pressure burning chest.  Her ability to exercise is limited due to the fact that she got chronic back problem.  She is getting ready to have some additional injection for relief of the pain.  Past Medical History:  Diagnosis Date  . Arthritis KNEES AND FEET  . GERD (gastroesophageal reflux disease)   . H/O hiatal hernia   . History of urinary stone BLADDER STONE EXTRACTIN IN OFFICE JUNE 2013  . Lesion of bladder     Past Surgical History:  Procedure Laterality Date  . CYSTOSCOPY WITH BIOPSY  08/06/2011   Procedure: CYSTOSCOPY WITH BIOPSY;  Surgeon: Claybon Jabs, MD;  Location: [facility];  Service: Urology;  Laterality: N/A;  30 mins requested for this case  BLADDER BIOPSY  CAMERA GYRUS  . PARTIAL COLECTOMY  2009   RESECTION POLYPS (BENIGN)    Current Medications: Current Meds  Medication Sig  . Ferrous Sulfate (IRON) 325 (65 FE) MG TABS Take 1 tablet by mouth every other day.   . Fluticasone Furoate-Vilanterol (BREO ELLIPTA IN) Inhale 1 puff into the lungs daily.  . furosemide (LASIX) 40 MG tablet Take 60 mg by mouth daily.  Marland Kitchen loratadine (CLARITIN) 10 MG tablet Take 10 mg by mouth daily.   Marland Kitchen losartan (COZAAR) 100 MG tablet Take 100 mg by mouth daily.   . pantoprazole (PROTONIX) 40 MG tablet Take 40 mg by mouth every morning.  . potassium chloride SA (KLOR-CON M20) 20 MEQ tablet  Take 3 tablets (60 mEq total) by mouth daily.     Allergies:   Levofloxacin   Social History   Socioeconomic History  . Marital status: Widowed    Spouse name: Not on file  . Number of children: Not on file  . Years of education: Not on file  . Highest education level: Not on file  Occupational History  . Not on file  Social Needs  . Financial resource strain: Not on file  . Food insecurity:    Worry: Not on file    Inability: Not on file  . Transportation needs:    Medical: Not on file    Non-medical: Not on file  Tobacco Use  . Smoking status: Never Smoker  . Smokeless tobacco: Never Used  Substance and Sexual Activity  . Alcohol use: No  . Drug use: No  . Sexual activity: Not on file  Lifestyle  . Physical activity:    Days per week: Not on file    Minutes per session: Not on file  . Stress: Not on file  Relationships  . Social connections:    Talks on phone: Not on file    Gets together: Not on file    Attends religious service: Not on file    Active member of club or organization: Not on file    Attends meetings of clubs  or organizations: Not on file    Relationship status: Not on file  Other Topics Concern  . Not on file  Social History Narrative  . Not on file     Family History: The patient's family history includes Asthma in her mother; Cancer in her mother; Diabetes in her mother; Heart disease in her father. ROS:   Please see the history of present illness.    All 14 point review of systems negative except as described per history of present illness  EKGs/Labs/Other Studies Reviewed:      Recent Labs: 03/28/2018: BUN 26; Creatinine, Ser 0.95; NT-Pro BNP 253; Potassium 4.5; Sodium 145  Recent Lipid Panel No results found for: CHOL, TRIG, HDL, CHOLHDL, VLDL, LDLCALC, LDLDIRECT  Physical Exam:    VS:  BP 120/66   Pulse (!) 58   Ht 5\' 6"  (1.676 m)   Wt 156 lb 6.4 oz (70.9 kg)   SpO2 96%   BMI 25.24 kg/m     Wt Readings from Last 3  Encounters:  04/01/18 156 lb 6.4 oz (70.9 kg)  01/31/18 155 lb 12.8 oz (70.7 kg)  01/08/18 154 lb 12.8 oz (70.2 kg)     GEN:  Well nourished, well developed in no acute distress HEENT: Normal NECK: No JVD; No carotid bruits LYMPHATICS: No lymphadenopathy CARDIAC: RRR, no murmurs, no rubs, no gallops RESPIRATORY:  Clear to auscultation without rales, wheezing or rhonchi  ABDOMEN: Soft, non-tender, non-distended MUSCULOSKELETAL:  No edema; No deformity  SKIN: Warm and dry LOWER EXTREMITIES: 1+ swelling NEUROLOGIC:  Alert and oriented x 3 PSYCHIATRIC:  Normal affect   ASSESSMENT:    1. Chronic right-sided congestive heart failure ([place])   2. Essential hypertension   3. Simple chronic bronchitis ([place])   4. Mixed hyperlipidemia   5. Dyspnea on exertion    PLAN:    In order of problems listed above:  1. Chronic right-sided congestive heart failure.  Swelling of lower extremities slightly worse we spent a great deal of time talking about fluid restriction and salt restriction.  I will increase dose of the furosemide from 60 to 80 mg daily I would not add any potassium but next week we will check her Chem-7.  I would like to see her back in my office in about a month to see how she does. 2. Central hypertension blood pressure well controlled continue present management. 3. Simple chronic bronchitis to be followed by internal medicine team. 4. Mixed dyslipidemia continue monitoring.  We will schedule her to have fasting lipid profile when she will be back here next time for her Chem-7. 5. Dyspnea on exertion: Stable.   Medication Adjustments/Labs and Tests Ordered: Current medicines are reviewed at length with the patient today.  Concerns regarding medicines are outlined above.  No orders of the defined types were placed in this encounter.  Medication changes: No orders of the defined types were placed in this encounter.   Signed, Park Liter, MD, [facility] 04/01/2018 1:50 PM     [place]

## 2018-04-01 NOTE — Patient Instructions (Signed)
Medication Instructions:  Your physician has recommended you make the following change in your medication:  Increase: Lasix to 80 mg daily  If you need a refill on your cardiac medications before your next appointment, please call your pharmacy.   Lab work: Your physician recommends that you return for lab work in 1 week fasting: bmp, bnp, and lipids   If you have labs (blood work) drawn today and your tests are completely normal, you will receive your results only by: Marland Kitchen MyChart Message (if you have MyChart) OR . A paper copy in the mail If you have any lab test that is abnormal or we need to change your treatment, we will call you to review the results.  Testing/Procedures: None.   Follow-Up: At [facility], you and your health needs are our priority.  As part of our continuing mission to provide you with exceptional heart care, we have created designated Provider Care Teams.  These Care Teams include your primary Cardiologist (physician) and Advanced Practice Providers (APPs -  Physician Assistants and Nurse Practitioners) who all work together to provide you with the care you need, when you need it. You will need a follow up appointment in 1 months.  Please call our office 2 months in advance to schedule this appointment.  You may see No primary care provider on file. or another member of our [employer] Provider Team in [place]: Shirlee More, MD . Jyl Heinz, MD  Any Other Special Instructions Will Be Listed Below (If Applicable).

## 2018-04-03 ENCOUNTER — Ambulatory Visit: Payer: MEDICARE | Admitting: Cardiology

## 2018-04-12 LAB — PRO B NATRIURETIC PEPTIDE: NT-Pro BNP: 110 pg/mL (ref 0–738)

## 2018-04-12 LAB — BASIC METABOLIC PANEL
BUN/Creatinine Ratio: 30 — ABNORMAL HIGH (ref 12–28)
BUN: 37 mg/dL — ABNORMAL HIGH (ref 8–27)
CO2: 18 mmol/L — ABNORMAL LOW (ref 20–29)
Calcium: 8.8 mg/dL (ref 8.7–10.3)
Chloride: 112 mmol/L — ABNORMAL HIGH (ref 96–106)
Creatinine, Ser: 1.25 mg/dL — ABNORMAL HIGH (ref 0.57–1.00)
GFR calc Af Amer: 47 mL/min/{1.73_m2} — ABNORMAL LOW (ref >59)
GFR, EST NON AFRICAN AMERICAN: 41 mL/min/{1.73_m2} — AB (ref >59)
Glucose: 93 mg/dL (ref 65–99)
Potassium: 4.3 mmol/L (ref 3.5–5.2)
Sodium: 145 mmol/L — ABNORMAL HIGH (ref 134–144)

## 2018-04-12 LAB — LIPID PANEL
Chol/HDL Ratio: 4.8 ratio — ABNORMAL HIGH (ref 0.0–4.4)
Cholesterol, Total: 193 mg/dL (ref 100–199)
HDL: 40 mg/dL (ref >39)
LDL Calculated: 130 mg/dL — ABNORMAL HIGH (ref 0–99)
Triglycerides: 117 mg/dL (ref 0–149)
VLDL Cholesterol Cal: 23 mg/dL (ref 5–40)

## 2018-04-14 ENCOUNTER — Other Ambulatory Visit: Payer: Self-pay

## 2018-04-14 MED ORDER — ATORVASTATIN CALCIUM 10 MG PO TABS: 10.0000 mg | ORAL_TABLET | Freq: Every day | ORAL | 3 refills | Status: DC

## 2018-04-28 ENCOUNTER — Ambulatory Visit (INDEPENDENT_AMBULATORY_CARE_PROVIDER_SITE_OTHER): Payer: MEDICARE | Admitting: Cardiology

## 2018-04-28 ENCOUNTER — Other Ambulatory Visit: Payer: Self-pay

## 2018-04-28 ENCOUNTER — Encounter: Payer: Self-pay | Admitting: Cardiology

## 2018-04-28 VITALS — BP 116/62 | HR 72 | Ht 66.0 in | Wt 154.0 lb

## 2018-04-28 DIAGNOSIS — J410 Simple chronic bronchitis: Secondary | ICD-10-CM

## 2018-04-28 DIAGNOSIS — I10 Essential (primary) hypertension: Secondary | ICD-10-CM

## 2018-04-28 DIAGNOSIS — I50812 Chronic right heart failure: Secondary | ICD-10-CM

## 2018-04-28 DIAGNOSIS — R600 Localized edema: Secondary | ICD-10-CM

## 2018-04-28 NOTE — Progress Notes (Signed)
Cardiology Office Note:    Date:  04/28/2018   ID:  Shannon Villegas, DOB [birth year], MRN [id number]  PCP:  Elisabeth Cara, PA-C  Cardiologist:  Jenne Campus, MD    Referring MD: Elisabeth Cara, *   Chief Complaint  Patient presents with  . 1 month follow up  Doing better  History of Present Illness:    Shannon Villegas is a 78 y.o. female with right-sided heart failure.  Comes today to my office for follow-up overall doing well denies have any chest pain tightness squeezing pressure burning chest.  Does have some exertional shortness of breath as usual swelling of lower extremities is much improved.  Past Medical History:  Diagnosis Date  . Arthritis KNEES AND FEET  . GERD (gastroesophageal reflux disease)   . H/O hiatal hernia   . History of urinary stone BLADDER STONE EXTRACTIN IN OFFICE JUNE 2013  . Lesion of bladder     Past Surgical History:  Procedure Laterality Date  . CYSTOSCOPY WITH BIOPSY  08/06/2011   Procedure: CYSTOSCOPY WITH BIOPSY;  Surgeon: Claybon Jabs, MD;  Location: [facility];  Service: Urology;  Laterality: N/A;  30 mins requested for this case  BLADDER BIOPSY  CAMERA GYRUS  . PARTIAL COLECTOMY  2009   RESECTION POLYPS (BENIGN)    Current Medications: Current Meds  Medication Sig  . atorvastatin (LIPITOR) 10 MG tablet Take 1 tablet (10 mg total) by mouth daily.  . Ferrous Sulfate (IRON) 325 (65 FE) MG TABS Take 1 tablet by mouth every other day.   . Fluticasone Furoate-Vilanterol (BREO ELLIPTA IN) Inhale 1 puff into the lungs daily.  . furosemide (LASIX) 80 MG tablet Take 80 mg daily  . loratadine (CLARITIN) 10 MG tablet Take 10 mg by mouth daily.   Marland Kitchen losartan (COZAAR) 100 MG tablet Take 100 mg by mouth daily.   . pantoprazole (PROTONIX) 40 MG tablet Take 40 mg by mouth every morning.  . potassium chloride SA (KLOR-CON M20) 20 MEQ tablet Take 3 tablets (60 mEq total) by mouth daily.     Allergies:    Levofloxacin   Social History   Socioeconomic History  . Marital status: Widowed    Spouse name: Not on file  . Number of children: Not on file  . Years of education: Not on file  . Highest education level: Not on file  Occupational History  . Not on file  Social Needs  . Financial resource strain: Not on file  . Food insecurity:    Worry: Not on file    Inability: Not on file  . Transportation needs:    Medical: Not on file    Non-medical: Not on file  Tobacco Use  . Smoking status: Never Smoker  . Smokeless tobacco: Never Used  Substance and Sexual Activity  . Alcohol use: No  . Drug use: No  . Sexual activity: Not on file  Lifestyle  . Physical activity:    Days per week: Not on file    Minutes per session: Not on file  . Stress: Not on file  Relationships  . Social connections:    Talks on phone: Not on file    Gets together: Not on file    Attends religious service: Not on file    Active member of club or organization: Not on file    Attends meetings of clubs or organizations: Not on file    Relationship status: Not on file  Other  Topics Concern  . Not on file  Social History Narrative  . Not on file     Family History: The patient's family history includes Asthma in her mother; Cancer in her mother; Diabetes in her mother; Heart disease in her father. ROS:   Please see the history of present illness.    All 14 point review of systems negative except as described per history of present illness  EKGs/Labs/Other Studies Reviewed:      Recent Labs: 04/11/2018: BUN 37; Creatinine, Ser 1.25; NT-Pro BNP 110; Potassium 4.3; Sodium 145  Recent Lipid Panel    Component Value Date/Time   CHOL 193 04/11/2018 1006   TRIG 117 04/11/2018 1006   HDL 40 04/11/2018 1006   CHOLHDL 4.8 (H) 04/11/2018 1006   LDLCALC 130 (H) 04/11/2018 1006    Physical Exam:    VS:  BP 116/62   Pulse 72   Ht 5\' 6"  (1.676 m)   Wt 154 lb (69.9 kg)   SpO2 93%   BMI 24.86 kg/m      Wt Readings from Last 3 Encounters:  04/28/18 154 lb (69.9 kg)  04/01/18 156 lb 6.4 oz (70.9 kg)  01/31/18 155 lb 12.8 oz (70.7 kg)     GEN:  Well nourished, well developed in no acute distress HEENT: Normal NECK: No JVD; No carotid bruits LYMPHATICS: No lymphadenopathy CARDIAC: RRR, no murmurs, no rubs, no gallops RESPIRATORY:  Clear to auscultation without rales, wheezing or rhonchi  ABDOMEN: Soft, non-tender, non-distended MUSCULOSKELETAL:  No edema; No deformity  SKIN: Warm and dry LOWER EXTREMITIES: no swelling NEUROLOGIC:  Alert and oriented x 3 PSYCHIATRIC:  Normal affect   ASSESSMENT:    1. Chronic right-sided congestive heart failure ([place])   2. Essential hypertension   3. Simple chronic bronchitis ([place])   4. Pedal edema    PLAN:    In order of problems listed above:  1. Right-sided congestive heart failure seems to be compensated I will check Chem-7 proBNP today.  I anticipate need to decrease dose of furosemide.  More about kidney function 2. Essential hypertension hypertension seems to be well controlled continue present management. 3. Chronic bronchitis stable 4. Pedal edema.  Improved   Medication Adjustments/Labs and Tests Ordered: Current medicines are reviewed at length with the patient today.  Concerns regarding medicines are outlined above.  No orders of the defined types were placed in this encounter.  Medication changes: No orders of the defined types were placed in this encounter.   Signed, Park Liter, MD,  [facility] 04/28/2018 3:17 PM    [place]

## 2018-04-28 NOTE — Patient Instructions (Signed)
Medication Instructions:  Your physician recommends that you continue on your current medications as directed. Please refer to the Current Medication list given to you today.  If you need a refill on your cardiac medications before your next appointment, please call your pharmacy.   Lab work: Your physician recommends that you have the following labs drawn: BMP  If you have labs (blood work) drawn today and your tests are completely normal, you will receive your results only by: Marland Kitchen MyChart Message (if you have MyChart) OR . A paper copy in the mail If you have any lab test that is abnormal or we need to change your treatment, we will call you to review the results.  Testing/Procedures: None  Follow-Up: At [facility], you and your health needs are our priority.  As part of our continuing mission to provide you with exceptional heart care, we have created designated Provider Care Teams.  These Care Teams include your primary Cardiologist (physician) and Advanced Practice Providers (APPs -  Physician Assistants and Nurse Practitioners) who all work together to provide you with the care you need, when you need it. You will need a follow up appointment in 2 months.  You may see  Jenne Campus or another member of our [employer] Provider Team in [place]: Shirlee More, MD . Jyl Heinz, MD

## 2018-04-30 ENCOUNTER — Telehealth: Payer: Self-pay | Admitting: Emergency Medicine

## 2018-04-30 DIAGNOSIS — I10 Essential (primary) hypertension: Secondary | ICD-10-CM

## 2018-04-30 LAB — BASIC METABOLIC PANEL
BUN/Creatinine Ratio: 23 (ref 12–28)
BUN: 38 mg/dL — ABNORMAL HIGH (ref 8–27)
CO2: 16 mmol/L — ABNORMAL LOW (ref 20–29)
Calcium: 8.8 mg/dL (ref 8.7–10.3)
Chloride: 107 mmol/L — ABNORMAL HIGH (ref 96–106)
Creatinine, Ser: 1.64 mg/dL — ABNORMAL HIGH (ref 0.57–1.00)
GFR calc Af Amer: 34 mL/min/{1.73_m2} — ABNORMAL LOW (ref >59)
GFR calc non Af Amer: 30 mL/min/{1.73_m2} — ABNORMAL LOW (ref >59)
Glucose: 85 mg/dL (ref 65–99)
Potassium: 3.9 mmol/L (ref 3.5–5.2)
SODIUM: 141 mmol/L (ref 134–144)

## 2018-04-30 NOTE — Telephone Encounter (Signed)
Patient informed of results and advised to have labs redrawn in two weeks. Patient verbally understands.

## 2018-06-17 ENCOUNTER — Other Ambulatory Visit: Payer: Self-pay | Admitting: Cardiology

## 2018-06-30 ENCOUNTER — Telehealth: Payer: Self-pay | Admitting: *Deleted

## 2018-06-30 ENCOUNTER — Other Ambulatory Visit: Payer: Self-pay | Admitting: Cardiology

## 2018-06-30 ENCOUNTER — Telehealth: Payer: Self-pay | Admitting: Cardiology

## 2018-06-30 DIAGNOSIS — R600 Localized edema: Secondary | ICD-10-CM

## 2018-06-30 DIAGNOSIS — R0609 Other forms of dyspnea: Secondary | ICD-10-CM

## 2018-06-30 NOTE — Telephone Encounter (Signed)
Left voice mail that lab orders are in and patient may go to the office between 8 and 5.

## 2018-06-30 NOTE — Telephone Encounter (Signed)
Pt would like to come in ahead of time for blood work before see's Dr. Agustin Cree. Please advise.

## 2018-06-30 NOTE — Telephone Encounter (Signed)
Virtual Visit Pre-Appointment Phone Call  "(Name), I am calling you today to discuss your upcoming appointment. We are currently trying to limit exposure to the virus that causes COVID-19 by seeing patients at home rather than in the office."  1. "What is the BEST phone number to call the day of the visit?" - include this in appointment notes  2. Do you have or have access to (through a family member/friend) a smartphone with video capability that we can use for your visit?" a. If yes - list this number in appt notes as cell (if different from BEST phone #) and list the appointment type as a VIDEO visit in appointment notes b. If no - list the appointment type as a PHONE visit in appointment notes  3. Confirm consent - "In the setting of the current Covid19 crisis, you are scheduled for a (phone or video) visit with your provider on (date) at (time).  Just as we do with many in-office visits, in order for you to participate in this visit, we must obtain consent.  If you'd like, I can send this to your mychart (if signed up) or email for you to review.  Otherwise, I can obtain your verbal consent now.  All virtual visits are billed to your insurance company just like a normal visit would be.  By agreeing to a virtual visit, we'd like you to understand that the technology does not allow for your provider to perform an examination, and thus may limit your provider's ability to fully assess your condition. If your provider identifies any concerns that need to be evaluated in person, we will make arrangements to do so.  Finally, though the technology is pretty good, we cannot assure that it will always work on either your or our end, and in the setting of a video visit, we may have to convert it to a phone-only visit.  In either situation, we cannot ensure that we have a secure connection.  Are you willing to proceed?" STAFF: Did the patient verbally acknowledge consent to telehealth visit? Document  YES/NO here: YES  4. Advise patient to be prepared - "Two hours prior to your appointment, go ahead and check your blood pressure, pulse, oxygen saturation, and your weight (if you have the equipment to check those) and write them all down. When your visit starts, your provider will ask you for this information. If you have an Apple Watch or Kardia device, please plan to have heart rate information ready on the day of your appointment. Please have a pen and paper handy nearby the day of the visit as well."  5. Give patient instructions for MyChart download to smartphone OR Doximity/Doxy.me as below if video visit (depending on what platform provider is using)  6. Inform patient they will receive a phone call 15 minutes prior to their appointment time (may be from unknown caller ID) so they should be prepared to answer    TELEPHONE CALL NOTE  Shannon Villegas has been deemed a candidate for a follow-up tele-health visit to limit community exposure during the Covid-19 pandemic. I spoke with the patient via phone to ensure availability of phone/video source, confirm preferred email & phone number, and discuss instructions and expectations.  I reminded Shannon Villegas to be prepared with any vital sign and/or heart rhythm information that could potentially be obtained via home monitoring, at the time of her visit. I reminded Shannon Villegas to expect a phone call prior to  her visit.  Isaiah Blakes 06/30/2018 10:06 AM   INSTRUCTIONS FOR DOWNLOADING THE MYCHART APP TO SMARTPHONE  - The patient must first make sure to have activated MyChart and know their login information - If Apple, go to [employer] and type in MyChart in the search bar and download the app. If Android, ask patient to go to [employer] and type in [place] in the search bar and download the app. The app is free but as with any other app downloads, their phone may require them to verify saved payment information or  Apple/Android password.  - The patient will need to then log into the app with their MyChart username and password, and select [facility] as their healthcare provider to link the account. When it is time for your visit, go to the MyChart app, find appointments, and click Begin Video Visit. Be sure to Select Allow for your device to access the Microphone and Camera for your visit. You will then be connected, and your provider will be with you shortly.  **If they have any issues connecting, or need assistance please contact MyChart service desk [phone])**  **If using a computer, in order to ensure the best quality for their visit they will need to use either of the following Internet Browsers: [employer], or Google Chrome**  IF USING DOXIMITY or DOXY.ME - The patient will receive a link just prior to their visit by text.     FULL LENGTH CONSENT FOR TELE-HEALTH VISIT   I hereby voluntarily request, consent and authorize [facility] and its employed or contracted physicians, physician assistants, nurse practitioners or other licensed health care professionals (the Practitioner), to provide me with telemedicine health care services (the Services") as deemed necessary by the treating Practitioner. I acknowledge and consent to receive the Services by the Practitioner via telemedicine. I understand that the telemedicine visit will involve communicating with the Practitioner through live audiovisual communication technology and the disclosure of certain medical information by electronic transmission. I acknowledge that I have been given the opportunity to request an in-person assessment or other available alternative prior to the telemedicine visit and am voluntarily participating in the telemedicine visit.  I understand that I have the right to withhold or withdraw my consent to the use of telemedicine in the course of my care at any time, without affecting my right to future care  or treatment, and that the Practitioner or I may terminate the telemedicine visit at any time. I understand that I have the right to inspect all information obtained and/or recorded in the course of the telemedicine visit and may receive copies of available information for a reasonable fee.  I understand that some of the potential risks of receiving the Services via telemedicine include:   Delay or interruption in medical evaluation due to technological equipment failure or disruption;  Information transmitted may not be sufficient (e.g. poor resolution of images) to allow for appropriate medical decision making by the Practitioner; and/or   In rare instances, security protocols could fail, causing a breach of personal health information.  Furthermore, I acknowledge that it is my responsibility to provide information about my medical history, conditions and care that is complete and accurate to the best of my ability. I acknowledge that Practitioner's advice, recommendations, and/or decision may be based on factors not within their control, such as incomplete or inaccurate data provided by me or distortions of diagnostic images or specimens that may result from electronic transmissions. I  understand that the practice of medicine is not an exact science and that Practitioner makes no warranties or guarantees regarding treatment outcomes. I acknowledge that I will receive a copy of this consent concurrently upon execution via email to the email address I last provided but may also request a printed copy by calling the office of [facility].    I understand that my insurance will be billed for this visit.   I have read or had this consent read to me.  I understand the contents of this consent, which adequately explains the benefits and risks of the Services being provided via telemedicine.   I have been provided ample opportunity to ask questions regarding this consent and the Services and have had  my questions answered to my satisfaction.  I give my informed consent for the services to be provided through the use of telemedicine in my medical care  By participating in this telemedicine visit I agree to the above.

## 2018-06-30 NOTE — Telephone Encounter (Signed)
She needs to have chem7 and proBNP done before

## 2018-06-30 NOTE — Addendum Note (Signed)
Addended by: Aleatha Borer on: 06/30/2018 02:59 PM   Modules accepted: Orders

## 2018-07-02 LAB — BASIC METABOLIC PANEL
BUN/Creatinine Ratio: 21 (ref 12–28)
BUN: 21 mg/dL (ref 8–27)
CO2: 18 mmol/L — AB (ref 20–29)
Calcium: 9 mg/dL (ref 8.7–10.3)
Chloride: 110 mmol/L — ABNORMAL HIGH (ref 96–106)
Creatinine, Ser: 1 mg/dL (ref 0.57–1.00)
GFR calc Af Amer: 62 mL/min/{1.73_m2} (ref >59)
GFR calc non Af Amer: 54 mL/min/{1.73_m2} — ABNORMAL LOW (ref >59)
Glucose: 90 mg/dL (ref 65–99)
Potassium: 3.7 mmol/L (ref 3.5–5.2)
SODIUM: 144 mmol/L (ref 134–144)

## 2018-07-04 ENCOUNTER — Telehealth: Payer: Self-pay

## 2018-07-04 NOTE — Telephone Encounter (Signed)
Left voicemail for patient to call for her lab results

## 2018-07-08 ENCOUNTER — Encounter: Payer: Self-pay | Admitting: Cardiology

## 2018-07-08 ENCOUNTER — Telehealth (INDEPENDENT_AMBULATORY_CARE_PROVIDER_SITE_OTHER): Payer: MEDICARE | Admitting: Cardiology

## 2018-07-08 ENCOUNTER — Other Ambulatory Visit: Payer: Self-pay

## 2018-07-08 DIAGNOSIS — K279 Peptic ulcer, site unspecified, unspecified as acute or chronic, without hemorrhage or perforation: Secondary | ICD-10-CM | POA: Diagnosis not present

## 2018-07-08 DIAGNOSIS — I10 Essential (primary) hypertension: Secondary | ICD-10-CM

## 2018-07-08 DIAGNOSIS — K219 Gastro-esophageal reflux disease without esophagitis: Secondary | ICD-10-CM | POA: Diagnosis not present

## 2018-07-08 DIAGNOSIS — R600 Localized edema: Secondary | ICD-10-CM

## 2018-07-08 DIAGNOSIS — R0609 Other forms of dyspnea: Secondary | ICD-10-CM

## 2018-07-08 DIAGNOSIS — R0600 Dyspnea, unspecified: Secondary | ICD-10-CM

## 2018-07-08 NOTE — Addendum Note (Signed)
Addended by: Ashok Norris on: 07/08/2018 03:11 PM   Modules accepted: Orders

## 2018-07-08 NOTE — Patient Instructions (Signed)
Medication Instructions:  Your physician recommends that you continue on your current medications as directed. Please refer to the Current Medication list given to you today.  If you need a refill on your cardiac medications before your next appointment, please call your pharmacy.   Lab work: Your physician recommends that you return for lab work when you come for the echo: Bmp, bnp   If you have labs (blood work) drawn today and your tests are completely normal, you will receive your results only by: Marland Kitchen MyChart Message (if you have MyChart) OR . A paper copy in the mail If you have any lab test that is abnormal or we need to change your treatment, we will call you to review the results.  Testing/Procedures: Your physician has requested that you have an echocardiogram. Echocardiography is a painless test that uses sound waves to create images of your heart. It provides your doctor with information about the size and shape of your heart and how well your heart's chambers and valves are working. This procedure takes approximately one hour. There are no restrictions for this procedure.    Follow-Up:  You are scheduled for a 6 week follow up appointment in [facility] with Dr. Agustin Cree   Any Other Special Instructions Will Be Listed Below (If Applicable).   Echocardiogram An echocardiogram is a procedure that uses painless sound waves (ultrasound) to produce an image of the heart. Images from an echocardiogram can provide important information about:  Signs of coronary artery disease (CAD).  Aneurysm detection. An aneurysm is a weak or damaged part of an artery wall that bulges out from the normal force of blood pumping through the body.  Heart size and shape. Changes in the size or shape of the heart can be associated with certain conditions, including heart failure, aneurysm, and CAD.  Heart muscle function.  Heart valve function.  Signs of a past heart attack.  Fluid buildup  around the heart.  Thickening of the heart muscle.  A tumor or infectious growth around the heart valves. Tell a health care provider about:  Any allergies you have.  All medicines you are taking, including vitamins, herbs, eye drops, creams, and over-the-counter medicines.  Any blood disorders you have.  Any surgeries you have had.  Any medical conditions you have.  Whether you are pregnant or may be pregnant. What are the risks? Generally, this is a safe procedure. However, problems may occur, including:  Allergic reaction to dye (contrast) that may be used during the procedure. What happens before the procedure? No specific preparation is needed. You may eat and drink normally. What happens during the procedure?   An IV tube may be inserted into one of your veins.  You may receive contrast through this tube. A contrast is an injection that improves the quality of the pictures from your heart.  A gel will be applied to your chest.  A wand-like tool (transducer) will be moved over your chest. The gel will help to transmit the sound waves from the transducer.  The sound waves will harmlessly bounce off of your heart to allow the heart images to be captured in real-time motion. The images will be recorded on a computer. The procedure may vary among health care providers and hospitals. What happens after the procedure?  You may return to your normal, everyday life, including diet, activities, and medicines, unless your health care provider tells you not to do that. Summary  An echocardiogram is a procedure that  uses painless sound waves (ultrasound) to produce an image of the heart.  Images from an echocardiogram can provide important information about the size and shape of your heart, heart muscle function, heart valve function, and fluid buildup around your heart.  You do not need to do anything to prepare before this procedure. You may eat and drink normally.  After  the echocardiogram is completed, you may return to your normal, everyday life, unless your health care provider tells you not to do that. This information is not intended to replace advice given to you by your health care provider. Make sure you discuss any questions you have with your health care provider. Document Released: 01/27/2000 Document Revised: 03/03/2016 Document Reviewed: 03/03/2016 Elsevier Interactive Patient Education  2019 [employer].

## 2018-07-08 NOTE — Progress Notes (Signed)
Virtual Visit via Telephone Note   This visit type was conducted due to national recommendations for restrictions regarding the COVID-19 Pandemic (e.g. social distancing) in an effort to limit this patient's exposure and mitigate transmission in our community.  Due to her co-morbid illnesses, this patient is at least at moderate risk for complications without adequate follow up.  This format is felt to be most appropriate for this patient at this time.  The patient did not have access to video technology/had technical difficulties with video requiring transitioning to audio format only (telephone).  All issues noted in this document were discussed and addressed.  No physical exam could be performed with this format.  Please refer to the patient's chart for her  consent to telehealth for [facility].  Evaluation Performed:  Follow-up visit  This visit type was conducted due to national recommendations for restrictions regarding the COVID-19 Pandemic (e.g. social distancing).  This format is felt to be most appropriate for this patient at this time.  All issues noted in this document were discussed and addressed.  No physical exam was performed (except for noted visual exam findings with Video Visits).  Please refer to the patient's chart (MyChart message for video visits and phone note for telephone visits) for the patient's consent to telehealth for [facility].  Date:  07/08/2018  ID: Shannon Villegas, DOB 1940/04/01, MRN [id number]   Patient Location: [place] [address]   Provider location:   [place] Office  PCP:  Elisabeth Cara, [place]  Cardiologist:  Jenne Campus, MD     Chief Complaint: Shortness of breath  History of Present Illness:    Shannon Villegas is a 78 y.o. female  who presents via audio/video conferencing for a telehealth visit today.  With history of COPD, also some indications of pulmonary hypertension however we have never  been able to provide by doing echocardiogram she was doing quite well swelling of lower extremity was good however today she complained of having some more shortness of breath this is a gradual process of repeat of months but started bothering her a lot.  Denies having any significant swelling of her legs she said her legs are looking good.  Denies having any chest pain but described to have heartburn typically happening after she eats or lays down she thinks that last sometimes all night she takes some Tums which helps with that.  It does not happen with exercise and truly believe she described heartburn.   The patient does not have symptoms concerning for COVID-19 infection (fever, chills, cough, or new SHORTNESS OF BREATH).    Prior CV studies:   The following studies were reviewed today:       Past Medical History:  Diagnosis Date  . Arthritis KNEES AND FEET  . GERD (gastroesophageal reflux disease)   . H/O hiatal hernia   . History of urinary stone BLADDER STONE EXTRACTIN IN OFFICE JUNE 2013  . Lesion of bladder     Past Surgical History:  Procedure Laterality Date  . CYSTOSCOPY WITH BIOPSY  08/06/2011   Procedure: CYSTOSCOPY WITH BIOPSY;  Surgeon: Claybon Jabs, MD;  Location[facility];  Service: Urology;  Laterality: N/A;  30 mins requested for this case  BLADDER BIOPSY  CAMERA GYRUS  . PARTIAL COLECTOMY  2009   RESECTION POLYPS (BENIGN)     Current Meds  Medication Sig  . atorvastatin (LIPITOR) 10 MG tablet TAKE 1 TABLET (  10 MG TOTAL) BY MOUTH DAILY.  Marland Kitchen Ferrous Sulfate (IRON) 325 (65 FE) MG TABS Take 1 tablet by mouth every other day.   . furosemide (LASIX) 80 MG tablet TAKE ONE TABLET BY MOUTH DAILY  . loratadine (CLARITIN) 10 MG tablet Take 10 mg by mouth daily.   Marland Kitchen losartan (COZAAR) 100 MG tablet Take 100 mg by mouth daily.   . pantoprazole (PROTONIX) 40 MG tablet Take 40 mg by mouth every morning.  . potassium chloride SA (K-DUR) 20 MEQ  tablet TAKE THREE TABLETS (60 MEQ TOTAL) BY MOUTH DAILY.      Family History: The patient's family history includes Asthma in her mother; Cancer in her mother; Diabetes in her mother; Heart disease in her father.   ROS:   Please see the history of present illness.     All other systems reviewed and are negative.   Labs/Other Tests and Data Reviewed:     Recent Labs: 04/11/2018: NT-Pro BNP 110 07/01/2018: BUN 21; Creatinine, Ser 1.00; Potassium 3.7; Sodium 144  Recent Lipid Panel    Component Value Date/Time   CHOL 193 04/11/2018 1006   TRIG 117 04/11/2018 1006   HDL 40 04/11/2018 1006   CHOLHDL 4.8 (H) 04/11/2018 1006   LDLCALC 130 (H) 04/11/2018 1006      Exam:    Vital Signs:  There were no vitals taken for this visit.    Wt Readings from Last 3 Encounters:  04/28/18 154 lb (69.9 kg)  04/01/18 156 lb 6.4 oz (70.9 kg)  01/31/18 155 lb 12.8 oz (70.7 kg)     Well nourished, well developed in no acute distress. Alert awake oriented x3 and talking to her over the phone via three-way conversation with her daughter and participating in conversation.  Diagnosis for this visit:   1. Essential hypertension   2. Dyspnea on exertion   3. PUD (peptic ulcer disease)   4. Gastroesophageal reflux disease without esophagitis   5. Pedal edema      ASSESSMENT & PLAN:    1.  Essential hypertension blood pressure well controlled continue present management. 2.  Dyspnea on exertion multifactorial we will do echocardiogram to reassess left ventricular ejection fraction. 3.  Peptic ulcer disease I recommended to her to try some Maalox to Mylanta. Pedal edema apparently better but shortness of breath obviously is worrisome.  Echocardiogram will be done to clarify degree of pulmonary pressure.  COVID-19 Education: The signs and symptoms of COVID-19 were discussed with the patient and how to seek care for testing (follow up with PCP or arrange E-visit).  The importance of social  distancing was discussed today.  Patient Risk:   After full review of this patients clinical status, I feel that they are at least moderate risk at this time.  Time:   Today, I have spent 21 minutes with the patient with telehealth technology discussing pt health issues.  I spent 5 minutes reviewing her chart before the visit.  Visit was finished at 3:01 PM.    Medication Adjustments/Labs and Tests Ordered: Current medicines are reviewed at length with the patient today.  Concerns regarding medicines are outlined above.  No orders of the defined types were placed in this encounter.  Medication changes: No orders of the defined types were placed in this encounter.    Disposition: Follow-up in 1 month  Signed, Park Liter, MD, [facility] 07/08/2018 3:02 PM    [facility]

## 2018-07-15 ENCOUNTER — Other Ambulatory Visit: Payer: Self-pay

## 2018-07-15 ENCOUNTER — Ambulatory Visit (HOSPITAL_BASED_OUTPATIENT_CLINIC_OR_DEPARTMENT_OTHER)
Admission: RE | Admit: 2018-07-15 | Discharge: 2018-07-15 | Disposition: A | Discharge status: Home / Self Care | Payer: MEDICARE | Source: Ambulatory Visit | Attending: Cardiology | Admitting: Cardiology

## 2018-07-15 DIAGNOSIS — R0609 Other forms of dyspnea: Secondary | ICD-10-CM | POA: Diagnosis not present

## 2018-07-15 NOTE — Progress Notes (Signed)
  Echocardiogram 2D Echocardiogram has been performed.  Shannon Villegas 07/15/2018, 4:04 PM

## 2018-07-16 LAB — BASIC METABOLIC PANEL
BUN/Creatinine Ratio: 20 (ref 12–28)
BUN: 21 mg/dL (ref 8–27)
CO2: 20 mmol/L (ref 20–29)
Calcium: 8.7 mg/dL (ref 8.7–10.3)
Chloride: 108 mmol/L — ABNORMAL HIGH (ref 96–106)
Creatinine, Ser: 1.03 mg/dL — ABNORMAL HIGH (ref 0.57–1.00)
GFR calc Af Amer: 60 mL/min/{1.73_m2} (ref >59)
GFR calc non Af Amer: 52 mL/min/{1.73_m2} — ABNORMAL LOW (ref >59)
Glucose: 83 mg/dL (ref 65–99)
Potassium: 3.6 mmol/L (ref 3.5–5.2)
Sodium: 142 mmol/L (ref 134–144)

## 2018-07-16 LAB — PRO B NATRIURETIC PEPTIDE: NT-Pro BNP: 179 pg/mL (ref 0–738)

## 2018-07-24 ENCOUNTER — Other Ambulatory Visit: Payer: Self-pay

## 2018-07-24 DIAGNOSIS — D649 Anemia, unspecified: Secondary | ICD-10-CM

## 2018-07-24 DIAGNOSIS — R0609 Other forms of dyspnea: Secondary | ICD-10-CM

## 2018-09-15 ENCOUNTER — Ambulatory Visit: Payer: MEDICARE | Admitting: Cardiology

## 2018-09-17 ENCOUNTER — Ambulatory Visit: Payer: MEDICARE | Admitting: Cardiology

## 2018-09-30 ENCOUNTER — Encounter: Payer: Self-pay | Admitting: Cardiology

## 2018-09-30 ENCOUNTER — Other Ambulatory Visit: Payer: Self-pay

## 2018-09-30 ENCOUNTER — Ambulatory Visit (INDEPENDENT_AMBULATORY_CARE_PROVIDER_SITE_OTHER): Payer: MEDICARE | Admitting: Cardiology

## 2018-09-30 VITALS — BP 120/64 | HR 100 | Ht 66.0 in | Wt 148.8 lb

## 2018-09-30 DIAGNOSIS — I50812 Chronic right heart failure: Secondary | ICD-10-CM | POA: Diagnosis not present

## 2018-09-30 DIAGNOSIS — R600 Localized edema: Secondary | ICD-10-CM | POA: Diagnosis not present

## 2018-09-30 DIAGNOSIS — I10 Essential (primary) hypertension: Secondary | ICD-10-CM

## 2018-09-30 DIAGNOSIS — R0609 Other forms of dyspnea: Secondary | ICD-10-CM

## 2018-09-30 DIAGNOSIS — J410 Simple chronic bronchitis: Secondary | ICD-10-CM | POA: Diagnosis not present

## 2018-09-30 MED ORDER — POTASSIUM CHLORIDE CRYS ER 20 MEQ PO TBCR: EXTENDED_RELEASE_TABLET | ORAL | 2 refills | Status: DC

## 2018-09-30 NOTE — Progress Notes (Signed)
Cardiology Office Note:    Date:  09/30/2018   ID:  Shannon Villegas, DOB Jul 23, 1940, MRN [id number]  PCP:  Elisabeth Cara, PA-C  Cardiologist:  Jenne Campus, MD    Referring MD: Elisabeth Cara, *   Chief Complaint  Patient presents with  . 6 week follow up  Doing well  History of Present Illness:    MARCHETA Villegas is a 78 y.o. female cor pulmonale, with cor pulmonale, congestive heart failure, chronic bronchitis with COPD.  Also hypertension.  Comes today to also follow-up overall after that she looks good denies having any chest pain tightness squeezing pressure mentions she lost some weight discussed minimal swelling of lower extremities but overall she is happy and she looks good  Past Medical History:  Diagnosis Date  . Arthritis KNEES AND FEET  . GERD (gastroesophageal reflux disease)   . H/O hiatal hernia   . History of urinary stone BLADDER STONE EXTRACTIN IN OFFICE JUNE 2013  . Lesion of bladder     Past Surgical History:  Procedure Laterality Date  . CYSTOSCOPY WITH BIOPSY  08/06/2011   Procedure: CYSTOSCOPY WITH BIOPSY;  Surgeon: Claybon Jabs, MD;  Location: [facility];  Service: Urology;  Laterality: N/A;  30 mins requested for this case  BLADDER BIOPSY  CAMERA GYRUS  . PARTIAL COLECTOMY  2009   RESECTION POLYPS (BENIGN)    Current Medications: Current Meds  Medication Sig  . Ferrous Sulfate (IRON) 325 (65 FE) MG TABS Take 1 tablet by mouth every other day.   . Fluticasone Furoate-Vilanterol (BREO ELLIPTA IN) Inhale 1 puff into the lungs daily.  . furosemide (LASIX) 80 MG tablet TAKE ONE TABLET BY MOUTH DAILY  . loratadine (CLARITIN) 10 MG tablet Take 10 mg by mouth daily.   Marland Kitchen losartan (COZAAR) 100 MG tablet Take 100 mg by mouth daily.   . pantoprazole (PROTONIX) 40 MG tablet Take 40 mg by mouth every morning.  . potassium chloride SA (K-DUR) 20 MEQ tablet TAKE THREE TABLETS (60 MEQ TOTAL) BY MOUTH DAILY.      Allergies:   Levofloxacin   Social History   Socioeconomic History  . Marital status: Widowed    Spouse name: Not on file  . Number of children: Not on file  . Years of education: Not on file  . Highest education level: Not on file  Occupational History  . Not on file  Social Needs  . Financial resource strain: Not on file  . Food insecurity    Worry: Not on file    Inability: Not on file  . Transportation needs    Medical: Not on file    Non-medical: Not on file  Tobacco Use  . Smoking status: Never Smoker  . Smokeless tobacco: Never Used  Substance and Sexual Activity  . Alcohol use: No  . Drug use: No  . Sexual activity: Not on file  Lifestyle  . Physical activity    Days per week: Not on file    Minutes per session: Not on file  . Stress: Not on file  Relationships  . Social Herbalist on phone: Not on file    Gets together: Not on file    Attends religious service: Not on file    Active member of club or organization: Not on file    Attends meetings of clubs or organizations: Not on file    Relationship status: Not on file  Other Topics Concern  .  Not on file  Social History Narrative  . Not on file     Family History: The patient's family history includes Asthma in her mother; Cancer in her mother; Diabetes in her mother; Heart disease in her father. ROS:   Please see the history of present illness.    All 14 point review of systems negative except as described per history of present illness  EKGs/Labs/Other Studies Reviewed:      Recent Labs: 07/15/2018: BUN 21; Creatinine, Ser 1.03; NT-Pro BNP 179; Potassium 3.6; Sodium 142  Recent Lipid Panel    Component Value Date/Time   CHOL 193 04/11/2018 1006   TRIG 117 04/11/2018 1006   HDL 40 04/11/2018 1006   CHOLHDL 4.8 (H) 04/11/2018 1006   LDLCALC 130 (H) 04/11/2018 1006    Physical Exam:    VS:  BP 120/64   Pulse 100   Ht 5\' 6"  (1.676 m)   Wt 148 lb 12.8 oz (67.5 kg)   SpO2 98%    BMI 24.02 kg/m     Wt Readings from Last 3 Encounters:  09/30/18 148 lb 12.8 oz (67.5 kg)  04/28/18 154 lb (69.9 kg)  04/01/18 156 lb 6.4 oz (70.9 kg)     GEN:  Well nourished, well developed in no acute distress HEENT: Normal NECK: No JVD; No carotid bruits LYMPHATICS: No lymphadenopathy CARDIAC: RRR, no murmurs, no rubs, no gallops RESPIRATORY: Poor air entry towards left base, overall does have few wheezes ABDOMEN: Soft, non-tender, non-distended MUSCULOSKELETAL:  No edema; No deformity  SKIN: Warm and dry LOWER EXTREMITIES: 1+ pitting edema NEUROLOGIC:  Alert and oriented x 3 PSYCHIATRIC:  Normal affect   ASSESSMENT:    1. Chronic right-sided congestive heart failure ([place])   2. Simple chronic bronchitis ([place])   3. Essential hypertension   4. Localized edema   5. Dyspnea on exertion    PLAN:    In order of problems listed above:  1. Chronic right-sided congestive heart failure.  Compensated.  Will check Chem-7 to make sure kidney function is maintained.  CBC will be done as well.  We will continue present management for now. 2. Simple chronic bronchitis.  Stable doing well followed by 10 medicine team. 3. Essential hypertension blood pressure well controlled 4. Localized edema involving lower extremities only 1+ doing well 5. Despite exertion stable.  Overall have to be she looks quite good.  And she feels good.  We will continue present management   Medication Adjustments/Labs and Tests Ordered: Current medicines are reviewed at length with the patient today.  Concerns regarding medicines are outlined above.  No orders of the defined types were placed in this encounter.  Medication changes: No orders of the defined types were placed in this encounter.   Signed, Park Liter, MD, [facility] 09/30/2018 1:34 PM    [place]

## 2018-09-30 NOTE — Patient Instructions (Addendum)
Medication Instructions:  Your physician recommends that you continue on your current medications as directed. Please refer to the Current Medication list given to you today.  If you need a refill on your cardiac medications before your next appointment, please call your pharmacy.   Lab work: Your physician recommends that you return for lab work: CHEM 7 CBC  If you have labs (blood work) drawn today and your tests are completely normal, you will receive your results only by: Marland Kitchen MyChart Message (if you have MyChart) OR . A paper copy in the mail If you have any lab test that is abnormal or we need to change your treatment, we will call you to review the results.  Testing/Procedures: NONE  Follow-Up: At [facility], you and your health needs are our priority.  As part of our continuing mission to provide you with exceptional heart care, we have created designated Provider Care Teams.  These Care Teams include your primary Cardiologist (physician) and Advanced Practice Providers (APPs -  Physician Assistants and Nurse Practitioners) who all work together to provide you with the care you need, when you need it. You will need a follow up appointment in 3 months.  Please call our office 2 months in advance to schedule this appointment.  You may see Dr. Agustin Cree or another member of our [facility] Provider Team in [place]: Shirlee More, MD . Jyl Heinz, MD  Any Other Special Instructions Will Be Listed Below (If Applicable).

## 2018-10-01 LAB — CBC
Hematocrit: 37.2 % (ref 34.0–46.6)
Hemoglobin: 11.7 g/dL (ref 11.1–15.9)
MCH: 29.2 pg (ref 26.6–33.0)
MCHC: 31.5 g/dL (ref 31.5–35.7)
MCV: 93 fL (ref 79–97)
Platelets: 337 10*3/uL (ref 150–450)
RBC: 4.01 x10E6/uL (ref 3.77–5.28)
RDW: 13.3 % (ref 11.7–15.4)
WBC: 7.4 10*3/uL (ref 3.4–10.8)

## 2018-10-01 LAB — BASIC METABOLIC PANEL
BUN/Creatinine Ratio: 18 (ref 12–28)
BUN: 24 mg/dL (ref 8–27)
CO2: 21 mmol/L (ref 20–29)
Calcium: 8.9 mg/dL (ref 8.7–10.3)
Chloride: 106 mmol/L (ref 96–106)
Creatinine, Ser: 1.34 mg/dL — ABNORMAL HIGH (ref 0.57–1.00)
GFR calc Af Amer: 43 mL/min/{1.73_m2} — ABNORMAL LOW (ref >59)
GFR calc non Af Amer: 38 mL/min/{1.73_m2} — ABNORMAL LOW (ref >59)
Glucose: 120 mg/dL — ABNORMAL HIGH (ref 65–99)
Potassium: 4.2 mmol/L (ref 3.5–5.2)
Sodium: 142 mmol/L (ref 134–144)

## 2018-11-17 ENCOUNTER — Other Ambulatory Visit: Payer: Self-pay | Admitting: Cardiology

## 2018-12-15 ENCOUNTER — Other Ambulatory Visit: Payer: Self-pay | Admitting: Cardiology

## 2018-12-31 ENCOUNTER — Other Ambulatory Visit: Payer: Self-pay

## 2018-12-31 ENCOUNTER — Encounter: Payer: Self-pay | Admitting: Cardiology

## 2018-12-31 ENCOUNTER — Ambulatory Visit (INDEPENDENT_AMBULATORY_CARE_PROVIDER_SITE_OTHER): Payer: MEDICARE | Admitting: Cardiology

## 2018-12-31 VITALS — BP 112/66 | HR 76 | Ht 66.0 in | Wt 148.3 lb

## 2018-12-31 DIAGNOSIS — J410 Simple chronic bronchitis: Secondary | ICD-10-CM | POA: Diagnosis not present

## 2018-12-31 DIAGNOSIS — I10 Essential (primary) hypertension: Secondary | ICD-10-CM | POA: Diagnosis not present

## 2018-12-31 DIAGNOSIS — R0600 Dyspnea, unspecified: Secondary | ICD-10-CM

## 2018-12-31 DIAGNOSIS — I50812 Chronic right heart failure: Secondary | ICD-10-CM | POA: Diagnosis not present

## 2018-12-31 DIAGNOSIS — R600 Localized edema: Secondary | ICD-10-CM

## 2018-12-31 DIAGNOSIS — R0609 Other forms of dyspnea: Secondary | ICD-10-CM

## 2018-12-31 NOTE — Patient Instructions (Addendum)
Medication Instructions:  Your physician recommends that you continue on your current medications as directed. Please refer to the Current Medication list given to you today.  *If you need a refill on your cardiac medications before your next appointment, please call your pharmacy*  Lab Work: None.  If you have labs (blood work) drawn today and your tests are completely normal, you will receive your results only by: Marland Kitchen MyChart Message (if you have MyChart) OR . A paper copy in the mail If you have any lab test that is abnormal or we need to change your treatment, we will call you to review the results.  Testing/Procedures: Your physician has requested that you have an echocardiogram. Echocardiography is a painless test that uses sound waves to create images of your heart. It provides your doctor with information about the size and shape of your heart and how well your heart's chambers and valves are working. This procedure takes approximately one hour. There are no restrictions for this procedure.     Follow-Up: At [facility], you and your health needs are our priority.  As part of our continuing mission to provide you with exceptional heart care, we have created designated Provider Care Teams.  These Care Teams include your primary Cardiologist (physician) and Advanced Practice Providers (APPs -  Physician Assistants and Nurse Practitioners) who all work together to provide you with the care you need, when you need it.  Your next appointment:   6 week(s)  The format for your next appointment:   In Person  Provider:   You may see Dr. Agustin Cree or the following Advanced Practice Provider on your designated Care Team:    Laurann Montana, FNP   Other Instructions    Echocardiogram An echocardiogram is a procedure that uses painless sound waves (ultrasound) to produce an image of the heart. Images from an echocardiogram can provide important information about:  Signs of  coronary artery disease (CAD).  Aneurysm detection. An aneurysm is a weak or damaged part of an artery wall that bulges out from the normal force of blood pumping through the body.  Heart size and shape. Changes in the size or shape of the heart can be associated with certain conditions, including heart failure, aneurysm, and CAD.  Heart muscle function.  Heart valve function.  Signs of a past heart attack.  Fluid buildup around the heart.  Thickening of the heart muscle.  A tumor or infectious growth around the heart valves. Tell a health care provider about:  Any allergies you have.  All medicines you are taking, including vitamins, herbs, eye drops, creams, and over-the-counter medicines.  Any blood disorders you have.  Any surgeries you have had.  Any medical conditions you have.  Whether you are pregnant or may be pregnant. What are the risks? Generally, this is a safe procedure. However, problems may occur, including:  Allergic reaction to dye (contrast) that may be used during the procedure. What happens before the procedure? No specific preparation is needed. You may eat and drink normally. What happens during the procedure?   An IV tube may be inserted into one of your veins.  You may receive contrast through this tube. A contrast is an injection that improves the quality of the pictures from your heart.  A gel will be applied to your chest.  A wand-like tool (transducer) will be moved over your chest. The gel will help to transmit the sound waves from the transducer.  The sound waves will  harmlessly bounce off of your heart to allow the heart images to be captured in real-time motion. The images will be recorded on a computer. The procedure may vary among health care providers and hospitals. What happens after the procedure?  You may return to your normal, everyday life, including diet, activities, and medicines, unless your health care provider tells you  not to do that. Summary  An echocardiogram is a procedure that uses painless sound waves (ultrasound) to produce an image of the heart.  Images from an echocardiogram can provide important information about the size and shape of your heart, heart muscle function, heart valve function, and fluid buildup around your heart.  You do not need to do anything to prepare before this procedure. You may eat and drink normally.  After the echocardiogram is completed, you may return to your normal, everyday life, unless your health care provider tells you not to do that. This information is not intended to replace advice given to you by your health care provider. Make sure you discuss any questions you have with your health care provider. Document Released: 01/27/2000 Document Revised: 05/22/2018 Document Reviewed: 03/03/2016 Elsevier Patient Education  2020 [employer].

## 2018-12-31 NOTE — Progress Notes (Signed)
Cardiology Office Note:    Date:  12/31/2018   ID:  LORESA Villegas, DOB Jul 05, 1940, MRN [id number]  PCP:  Elisabeth Cara, PA-C  Cardiologist:  Jenne Campus, MD    Referring MD: Elisabeth Cara, *   Chief Complaint  Patient presents with  . Follow-up  Doing well  History of Present Illness:    Shannon Villegas is a 78 y.o. female with right-sided heart failure, swelling of lower extremities, essential hypertension, COPD, kidney dysfunction yesterday she seen nephrologist recommendation was to cut down dose of furosemide.  She is taking 80 mg we can go on 40 I warned her about potential problem with this approach meaning I dissipate swelling of lower extremities to get worse we talked about reducing salt intake also told her to be careful about fluid meaning if she is thirsty she need to eat but she does not need intention puts too much fluid.  I asked her also to check her weight every single day.  And again I told her she probably gained few pounds as long as she does not have excessive shortness of breath and swelling of lower extremities not excessive that acceptable.  Also asked her to keep her legs elevated when she had a chance and also use elastic stockings.  In the future we will get a repeat her Chem-7 to see how his kidney is responding for this maneuver.  I will schedule her also to have echocardiogram to assess left ventricular ejection fraction.  Past Medical History:  Diagnosis Date  . Arthritis KNEES AND FEET  . GERD (gastroesophageal reflux disease)   . H/O hiatal hernia   . History of urinary stone BLADDER STONE EXTRACTIN IN OFFICE JUNE 2013  . Lesion of bladder     Past Surgical History:  Procedure Laterality Date  . CYSTOSCOPY WITH BIOPSY  08/06/2011   Procedure: CYSTOSCOPY WITH BIOPSY;  Surgeon: Claybon Jabs, MD;  Location: [facility];  Service: Urology;  Laterality: N/A;  30 mins requested for this case  BLADDER BIOPSY   CAMERA GYRUS  . PARTIAL COLECTOMY  2009   RESECTION POLYPS (BENIGN)    Current Medications: Current Meds  Medication Sig  . acetaminophen-codeine (TYLENOL #3) 300-30 MG tablet Take 1 tablet by mouth.  Marland Kitchen atorvastatin (LIPITOR) 10 MG tablet TAKE ONE TABLET BY MOUTH DAILY  . Ferrous Sulfate (IRON) 325 (65 FE) MG TABS Take 1 tablet by mouth every other day.   . Fluticasone Furoate-Vilanterol (BREO ELLIPTA IN) Inhale 1 puff into the lungs daily.  . Fluticasone-Salmeterol (ADVAIR) 250-50 MCG/DOSE AEPB Inhale 1 puff into the lungs every 12 (twelve) hours.  . furosemide (LASIX) 80 MG tablet TAKE ONE TABLET BY MOUTH DAILY  . loratadine (CLARITIN) 10 MG tablet Take 10 mg by mouth daily.   Marland Kitchen losartan (COZAAR) 100 MG tablet Take 100 mg by mouth daily.   . pantoprazole (PROTONIX) 40 MG tablet Take 40 mg by mouth every morning.  . potassium chloride SA (KLOR-CON) 20 MEQ tablet TAKE 3 TABLETS BY MOUTH DAILY     Allergies:   Levofloxacin   Social History   Socioeconomic History  . Marital status: Widowed    Spouse name: Not on file  . Number of children: Not on file  . Years of education: Not on file  . Highest education level: Not on file  Occupational History  . Not on file  Social Needs  . Financial resource strain: Not on file  . Food  insecurity    Worry: Not on file    Inability: Not on file  . Transportation needs    Medical: Not on file    Non-medical: Not on file  Tobacco Use  . Smoking status: Never Smoker  . Smokeless tobacco: Never Used  Substance and Sexual Activity  . Alcohol use: No  . Drug use: No  . Sexual activity: Not on file  Lifestyle  . Physical activity    Days per week: Not on file    Minutes per session: Not on file  . Stress: Not on file  Relationships  . Social Herbalist on phone: Not on file    Gets together: Not on file    Attends religious service: Not on file    Active member of club or organization: Not on file    Attends meetings  of clubs or organizations: Not on file    Relationship status: Not on file  Other Topics Concern  . Not on file  Social History Narrative  . Not on file     Family History: The patient's family history includes Asthma in her mother; Cancer in her mother; Diabetes in her mother; Heart disease in her father. ROS:   Please see the history of present illness.    All 14 point review of systems negative except as described per history of present illness  EKGs/Labs/Other Studies Reviewed:      Recent Labs: 07/15/2018: NT-Pro BNP 179 09/30/2018: BUN 24; Creatinine, Ser 1.34; Hemoglobin 11.7; Platelets 337; Potassium 4.2; Sodium 142  Recent Lipid Panel    Component Value Date/Time   CHOL 193 04/11/2018 1006   TRIG 117 04/11/2018 1006   HDL 40 04/11/2018 1006   CHOLHDL 4.8 (H) 04/11/2018 1006   LDLCALC 130 (H) 04/11/2018 1006    Physical Exam:    VS:  BP 112/66   Pulse 76   Ht 5\' 6"  (1.676 m)   Wt 148 lb 4.8 oz (67.3 kg)   SpO2 94%   BMI 23.94 kg/m     Wt Readings from Last 3 Encounters:  12/31/18 148 lb 4.8 oz (67.3 kg)  09/30/18 148 lb 12.8 oz (67.5 kg)  04/28/18 154 lb (69.9 kg)     GEN:  Well nourished, well developed in no acute distress HEENT: Normal NECK: No JVD; No carotid bruits LYMPHATICS: No lymphadenopathy CARDIAC: RRR, no murmurs, no rubs, no gallops RESPIRATORY: Poor air entry to the left base ABDOMEN: Soft, non-tender, non-distended MUSCULOSKELETAL:  No edema; No deformity  SKIN: Warm and dry LOWER EXTREMITIES: Minimal swelling NEUROLOGIC:  Alert and oriented x 3 PSYCHIATRIC:  Normal affect   ASSESSMENT:    1. Chronic right-sided congestive heart failure ([place])   2. Essential hypertension   3. Simple chronic bronchitis ([place])   4. Pedal edema   5. Dyspnea on exertion    PLAN:    In order of problems listed above:  1. Chronic right-sided congestive heart failure.  Echocardiogram will be done plan as outlined above. 2. Essential hypertension  blood pressure appears to be well controlled continue present management. 3. COPD stable. 4. Pedal edema only mild right now anticipate get worse after cutting down her diuretic which is necessary to preserve her kidney function.  We may use some extra dosages of furosemide on as-needed basis. 5. Dyspnea on exertion: Stable.   Medication Adjustments/Labs and Tests Ordered: Current medicines are reviewed at length with the patient today.  Concerns regarding medicines are outlined above.  No orders of the defined types were placed in this encounter.  Medication changes: No orders of the defined types were placed in this encounter.   Signed, Park Liter, MD, [facility] 12/31/2018 2:21 PM    [place]

## 2018-12-31 NOTE — Addendum Note (Signed)
Addended by: Ashok Norris on: 12/31/2018 02:30 PM   Modules accepted: Orders

## 2019-01-06 ENCOUNTER — Ambulatory Visit (HOSPITAL_BASED_OUTPATIENT_CLINIC_OR_DEPARTMENT_OTHER)
Admission: RE | Admit: 2019-01-06 | Discharge: 2019-01-06 | Disposition: A | Discharge status: Home / Self Care | Payer: MEDICARE | Source: Ambulatory Visit | Attending: Cardiology | Admitting: Cardiology

## 2019-01-06 ENCOUNTER — Other Ambulatory Visit: Payer: Self-pay

## 2019-01-06 DIAGNOSIS — I50812 Chronic right heart failure: Secondary | ICD-10-CM | POA: Diagnosis not present

## 2019-01-06 NOTE — Progress Notes (Signed)
  Echocardiogram 2D Echocardiogram has been performed.  Cardell Peach 01/06/2019, 10:36 AM

## 2019-02-09 ENCOUNTER — Other Ambulatory Visit: Payer: Self-pay | Admitting: Cardiology

## 2019-02-11 ENCOUNTER — Encounter: Payer: Self-pay | Admitting: Cardiology

## 2019-02-11 ENCOUNTER — Other Ambulatory Visit: Payer: Self-pay

## 2019-02-11 ENCOUNTER — Ambulatory Visit (INDEPENDENT_AMBULATORY_CARE_PROVIDER_SITE_OTHER): Payer: MEDICARE | Admitting: Cardiology

## 2019-02-11 VITALS — BP 100/60 | HR 63 | Ht 66.0 in | Wt 147.0 lb

## 2019-02-11 DIAGNOSIS — J410 Simple chronic bronchitis: Secondary | ICD-10-CM

## 2019-02-11 DIAGNOSIS — I10 Essential (primary) hypertension: Secondary | ICD-10-CM | POA: Diagnosis not present

## 2019-02-11 DIAGNOSIS — I50812 Chronic right heart failure: Secondary | ICD-10-CM

## 2019-02-11 DIAGNOSIS — R0609 Other forms of dyspnea: Secondary | ICD-10-CM

## 2019-02-11 DIAGNOSIS — R0600 Dyspnea, unspecified: Secondary | ICD-10-CM

## 2019-02-11 NOTE — Progress Notes (Signed)
Cardiology Office Note:    Date:  02/11/2019   ID:  Shannon Villegas, DOB April 10, 1940, MRN [id number]  PCP:  Shannon Cara, PA-C  Cardiologist:  Jenne Campus, MD    Referring MD: Belva Bertin, [place], *   No chief complaint on file. Doing well  History of Present Illness:    Shannon Villegas is a 78 y.o. female with past medical history significant for right-sided heart failure, bronchiectasis, chronic obstructive pulmonary disease, essential hypertension, dyslipidemia.  Comes today to my office for follow-up overall seems to be doing well.  Recently her dose of diuretic has been decreased because of deteriorating of kidney dysfunction.,  She does have some swelling of lower extremities today but shortness of breath is stable.  When asked her if she complains of today with herself year ago she said she is the same.  Denies have any chest pain palpitation tightness squeezing pressure burning chest  Past Medical History:  Diagnosis Date  . Arthritis KNEES AND FEET  . GERD (gastroesophageal reflux disease)   . H/O hiatal hernia   . History of urinary stone BLADDER STONE EXTRACTIN IN OFFICE JUNE 2013  . Lesion of bladder     Past Surgical History:  Procedure Laterality Date  . CYSTOSCOPY WITH BIOPSY  08/06/2011   Procedure: CYSTOSCOPY WITH BIOPSY;  Surgeon: Claybon Jabs, MD;  Location: [facility];  Service: Urology;  Laterality: N/A;  30 mins requested for this case  BLADDER BIOPSY  CAMERA GYRUS  . PARTIAL COLECTOMY  2009   RESECTION POLYPS (BENIGN)    Current Medications: Current Meds  Medication Sig  . acetaminophen-codeine (TYLENOL #3) 300-30 MG tablet Take 1 tablet by mouth.  Marland Kitchen atorvastatin (LIPITOR) 10 MG tablet TAKE ONE TABLET BY MOUTH DAILY  . Ferrous Sulfate (IRON) 325 (65 FE) MG TABS Take 1 tablet by mouth every other day.   . Fluticasone Furoate-Vilanterol (BREO ELLIPTA IN) Inhale 1 puff into the lungs daily.  .  Fluticasone-Salmeterol (ADVAIR) 250-50 MCG/DOSE AEPB Inhale 1 puff into the lungs every 12 (twelve) hours.  . furosemide (LASIX) 80 MG tablet TAKE ONE TABLET BY MOUTH DAILY  . loratadine (CLARITIN) 10 MG tablet Take 10 mg by mouth daily.   Marland Kitchen losartan (COZAAR) 100 MG tablet Take 100 mg by mouth daily.   . pantoprazole (PROTONIX) 40 MG tablet Take 40 mg by mouth every morning.  . potassium chloride SA (KLOR-CON) 20 MEQ tablet TAKE 3 TABLETS BY MOUTH DAILY     Allergies:   Levofloxacin   Social History   Socioeconomic History  . Marital status: Widowed    Spouse name: Not on file  . Number of children: Not on file  . Years of education: Not on file  . Highest education level: Not on file  Occupational History  . Not on file  Tobacco Use  . Smoking status: Never Smoker  . Smokeless tobacco: Never Used  Substance and Sexual Activity  . Alcohol use: No  . Drug use: No  . Sexual activity: Not on file  Other Topics Concern  . Not on file  Social History Narrative  . Not on file   Social Determinants of Health   Financial Resource Strain:   . Difficulty of Paying Living Expenses: Not on file  Food Insecurity:   . Worried About Charity fundraiser in the Last Year: Not on file  . Ran Out of Food in the Last Year: Not on file  Transportation Needs:   .  Lack of Transportation (Medical): Not on file  . Lack of Transportation (Non-Medical): Not on file  Physical Activity:   . Days of Exercise per Week: Not on file  . Minutes of Exercise per Session: Not on file  Stress:   . Feeling of Stress : Not on file  Social Connections:   . Frequency of Communication with Friends and Family: Not on file  . Frequency of Social Gatherings with Friends and Family: Not on file  . Attends Religious Services: Not on file  . Active Member of Clubs or Organizations: Not on file  . Attends Archivist Meetings: Not on file  . Marital Status: Not on file     Family History: The  patient's family history includes Asthma in her mother; Cancer in her mother; Diabetes in her mother; Heart disease in her father. ROS:   Please see the history of present illness.    All 14 point review of systems negative except as described per history of present illness  EKGs/Labs/Other Studies Reviewed:    EKG today showed normal sinus rhythm, normal P interval, normal QS complex duration morphology, no ST segment changes  Recent Labs: 07/15/2018: NT-Pro BNP 179 09/30/2018: BUN 24; Creatinine, Ser 1.34; Hemoglobin 11.7; Platelets 337; Potassium 4.2; Sodium 142  Recent Lipid Panel    Component Value Date/Time   CHOL 193 04/11/2018 1006   TRIG 117 04/11/2018 1006   HDL 40 04/11/2018 1006   CHOLHDL 4.8 (H) 04/11/2018 1006   LDLCALC 130 (H) 04/11/2018 1006    Physical Exam:    VS:  BP 100/60   Pulse 63   Ht 5\' 6"  (1.676 m)   Wt 147 lb (66.7 kg)   SpO2 95%   BMI 23.73 kg/m     Wt Readings from Last 3 Encounters:  02/11/19 147 lb (66.7 kg)  12/31/18 148 lb 4.8 oz (67.3 kg)  09/30/18 148 lb 12.8 oz (67.5 kg)     GEN:  Well nourished, well developed in no acute distress HEENT: Normal NECK: No JVD; No carotid bruits LYMPHATICS: No lymphadenopathy CARDIAC: RRR, no murmurs, no rubs, no gallops RESPIRATORY:  Clear to auscultation without rales, wheezing or rhonchi  ABDOMEN: Soft, non-tender, non-distended MUSCULOSKELETAL:  No edema; No deformity  SKIN: Warm and dry LOWER EXTREMITIES: no swelling NEUROLOGIC:  Alert and oriented x 3 PSYCHIATRIC:  Normal affect   ASSESSMENT:    1. Essential hypertension   2. Chronic right-sided congestive heart failure ([place])   3. Simple chronic bronchitis ([place])   4. Dyspnea on exertion    PLAN:    In order of problems listed above:  1. Essential hypertension blood pressure well controlled continue present management. 2. Chronic right-sided congestive heart failure stable there is some swelling of lower extremities I will do Chem-7  and proBNP today and based on that decide if we can increase dose of diuretic. 3. COPD noted follow-up antimedicine team. 4. Dyspnea on exertion followed by internal medicine team   Medication Adjustments/Labs and Tests Ordered: Current medicines are reviewed at length with the patient today.  Concerns regarding medicines are outlined above.  No orders of the defined types were placed in this encounter.  Medication changes: No orders of the defined types were placed in this encounter.   Signed, Park Liter, MD, [facility] 02/11/2019 10:55 AM    [place]

## 2019-02-11 NOTE — Patient Instructions (Signed)
Medication Instructions:  Your physician recommends that you continue on your current medications as directed. Please refer to the Current Medication list given to you today.  *If you need a refill on your cardiac medications before your next appointment, please call your pharmacy*  Lab Work: TODAY:  BMET & PRO BNP  If you have labs (blood work) drawn today and your tests are completely normal, you will receive your results only by: Marland Kitchen MyChart Message (if you have MyChart) OR . A paper copy in the mail If you have any lab test that is abnormal or we need to change your treatment, we will call you to review the results.  Testing/Procedures: None ordered  Follow-Up: At [facility], you and your health needs are our priority.  As part of our continuing mission to provide you with exceptional heart care, we have created designated Provider Care Teams.  These Care Teams include your primary Cardiologist (physician) and Advanced Practice Providers (APPs -  Physician Assistants and Nurse Practitioners) who all work together to provide you with the care you need, when you need it.  Your next appointment:   3 month(s)  The format for your next appointment:   In Person  Provider:   Jenne Campus, MD  Other Instructions

## 2019-02-12 LAB — BASIC METABOLIC PANEL
BUN/Creatinine Ratio: 14 (ref 12–28)
BUN: 19 mg/dL (ref 8–27)
CO2: 18 mmol/L — ABNORMAL LOW (ref 20–29)
Calcium: 8.7 mg/dL (ref 8.7–10.3)
Chloride: 111 mmol/L — ABNORMAL HIGH (ref 96–106)
Creatinine, Ser: 1.33 mg/dL — ABNORMAL HIGH (ref 0.57–1.00)
GFR calc Af Amer: 44 mL/min/{1.73_m2} — ABNORMAL LOW (ref >59)
GFR calc non Af Amer: 38 mL/min/{1.73_m2} — ABNORMAL LOW (ref >59)
Glucose: 85 mg/dL (ref 65–99)
Potassium: 3.9 mmol/L (ref 3.5–5.2)
Sodium: 144 mmol/L (ref 134–144)

## 2019-02-12 LAB — PRO B NATRIURETIC PEPTIDE: NT-Pro BNP: 274 pg/mL (ref 0–738)

## 2019-02-17 ENCOUNTER — Telehealth: Payer: Self-pay | Admitting: Emergency Medicine

## 2019-02-17 NOTE — Telephone Encounter (Signed)
Left message for patient to return call regarding results  

## 2019-04-27 IMAGING — CT CT CHEST W/ CM
2 of 4 series · 15 of 36 positions shown, 18 images · IV contrast (iopamidol)
Comparison: Chest radiograph 11/18/2017.  CT chest 01/27/2013.

CLINICAL DATA: Shortness of breath on exertion. Fluid on chest
x-ray.

EXAM:
CT CHEST WITH CONTRAST
TECHNIQUE: Multidetector CT imaging of the chest was performed during
intravenous contrast administration.
CONTRAST:  75mL P5H83K-9HH IOPAMIDOL (P5H83K-9HH) INJECTION 61%

[Series 2: thorax · axial · 0.76mm/px · z∈[-516,-268]mm · 12 of 146 slices shown, 15 images]
[im 11/146  mediastinal]
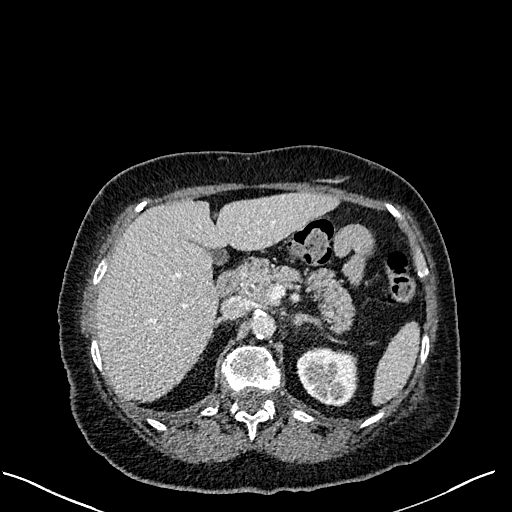
[im 11/146  lung]
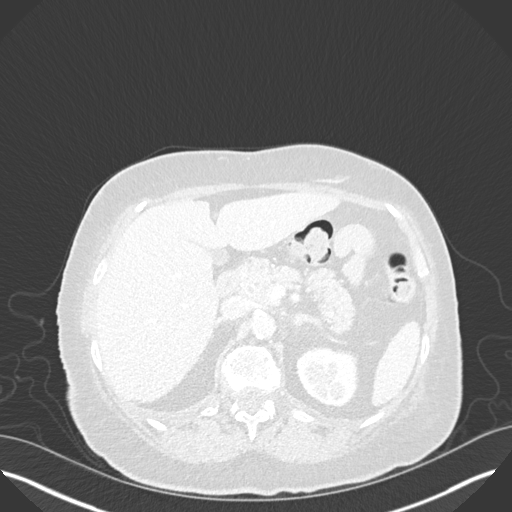
[im 21/146  lung]
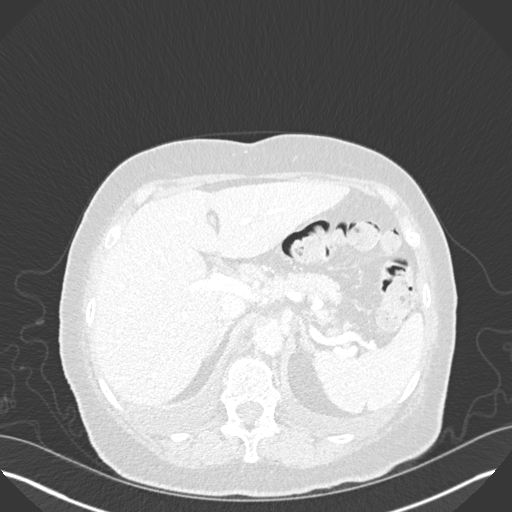
[im 32/146  lung]
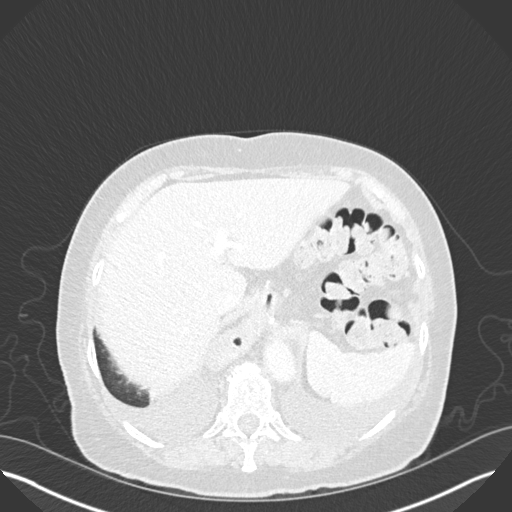
[im 42/146  lung]
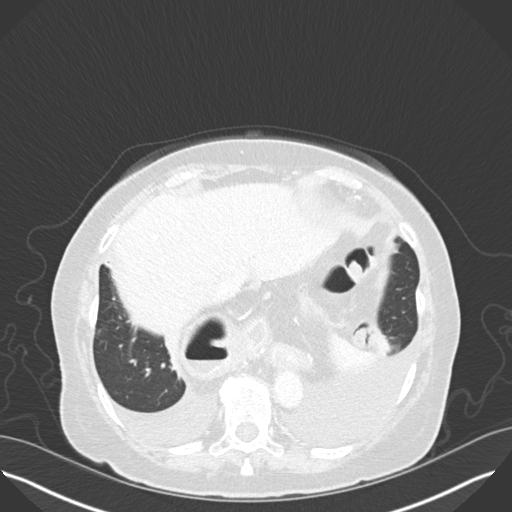
[im 52/146  mediastinal]
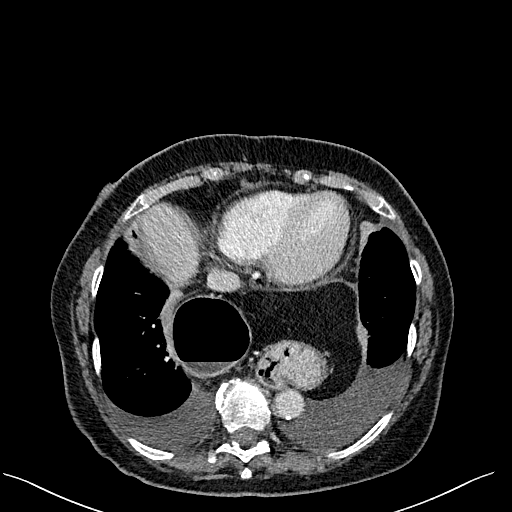
[im 52/146  lung]
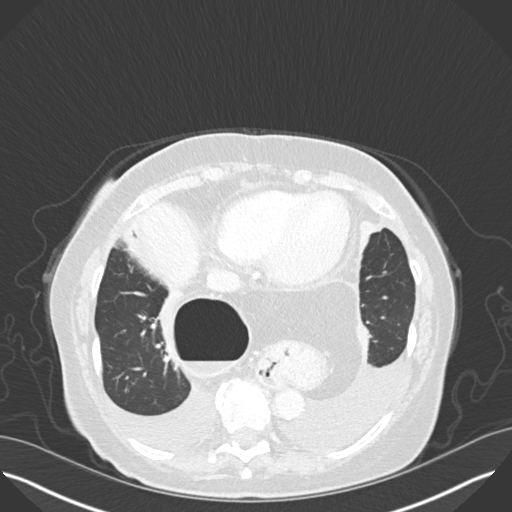
[im 63/146  lung]
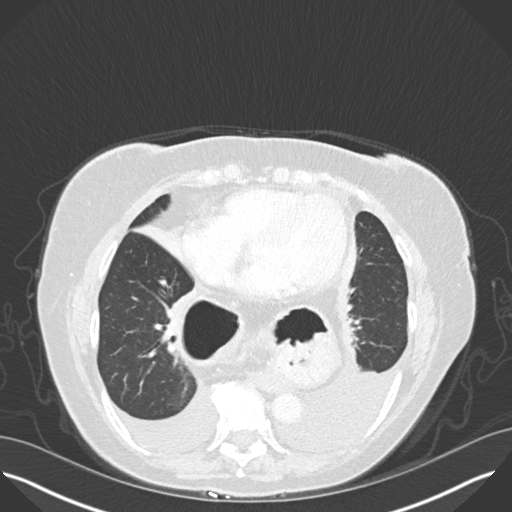
[im 83/146  lung]
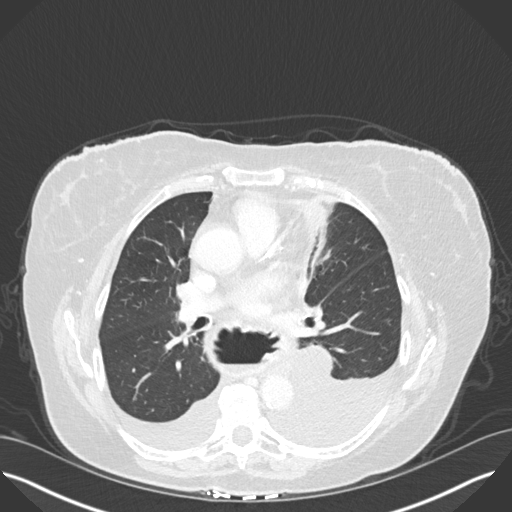
[im 94/146  lung]
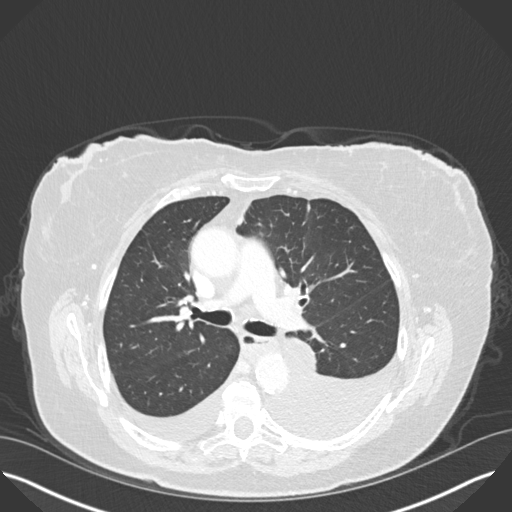
[im 104/146  mediastinal]
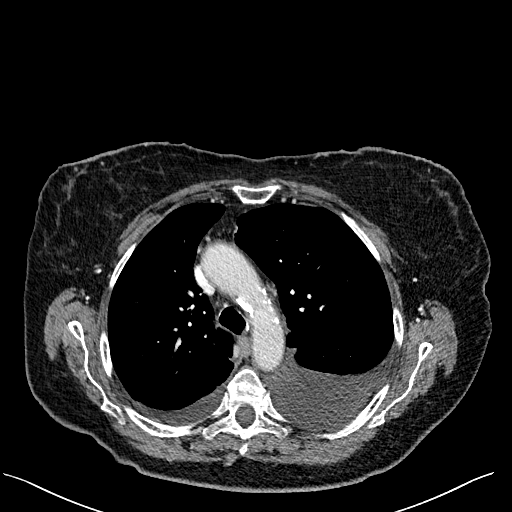
[im 104/146  lung]
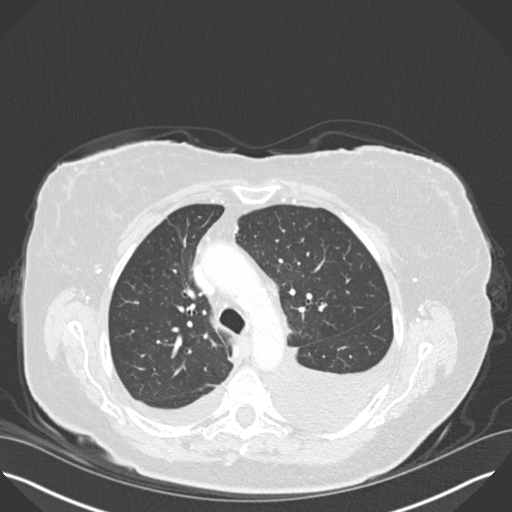
[im 114/146  lung]
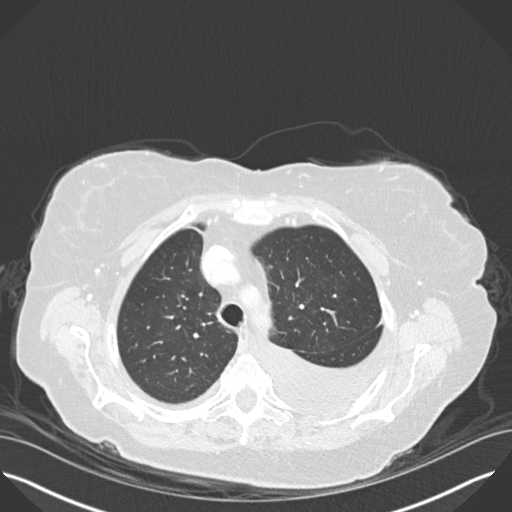
[im 125/146  lung]
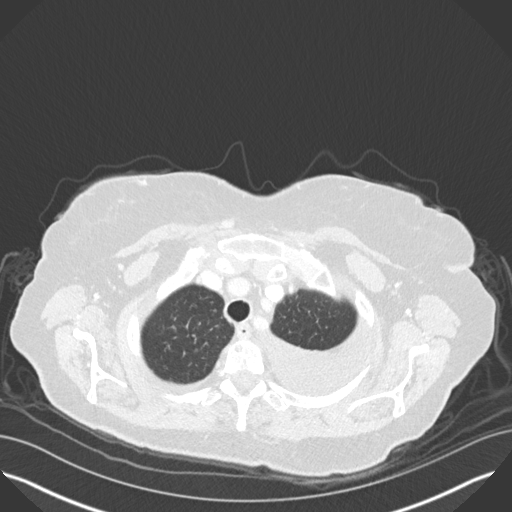
[im 135/146  lung]
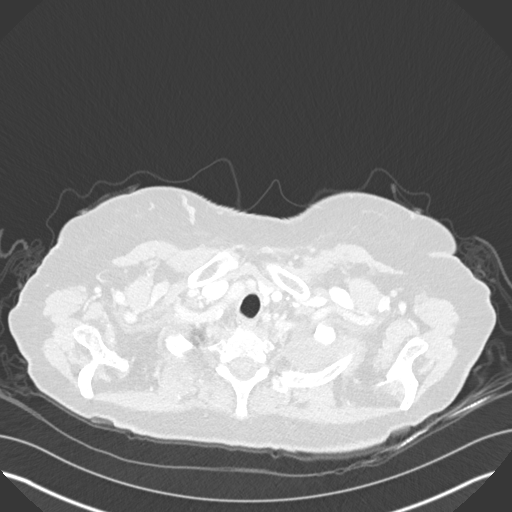

[Series 5: coronal · coronal · 0.59mm/px · 3 of 150 slices shown]
[im 30/150  lung]
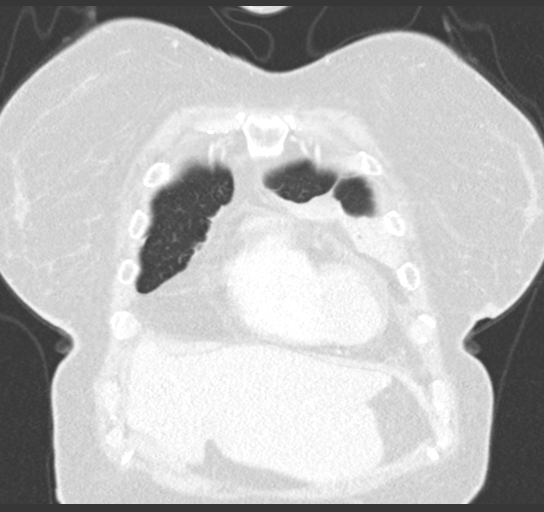
[im 60/150  lung]
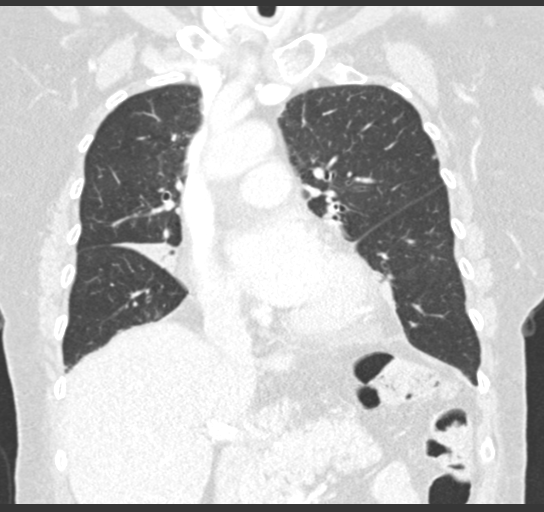
[im 90/150  lung]
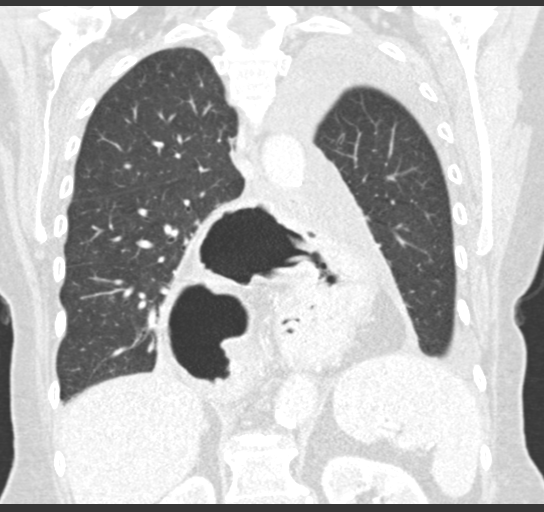

[15 of 36 positions shown; findings below may reference images not displayed]

FINDINGS: Cardiovascular: Atherosclerotic calcification of the arterial
vasculature, including coronary arteries. Heart is at the upper
limits of normal in size to mildly enlarged.

Mediastinum/Nodes: 1.5 cm low-attenuation nodule in the right
thyroid. No pathologically enlarged mediastinal, hilar or axillary
lymph nodes. Esophagus is grossly unremarkable. Large hiatal hernia.

Lungs/Pleura: Image quality is degraded by respiratory motion. Right
middle lobe volume loss has increased in the interval. New volume
loss and bronchiectasis in the medial aspect of the lingula. Mild
chronic volume loss in the medial aspects of both lower lobes,
adjacent to a large hiatal hernia. 4 mm linear subpleural nodule in
the left upper lobe, likely benign. Moderate right pleural effusion
and moderate to large left pleural effusion. Airway is otherwise
unremarkable.

Upper Abdomen: Visualized portions of the liver, gallbladder,
adrenal glands and right kidney are unremarkable. Millimetric
low-attenuation lesion in the left kidney is too small to
characterize. Visualized portions of the spleen and pancreas are
unremarkable. The entire stomach is seen within a hiatal hernia. No
upper abdominal adenopathy.

Musculoskeletal: Degenerative changes in the spine and shoulders.
IMPRESSION: 1. Moderate right pleural effusion and moderate to large left
pleural effusion.
2. Increasing right middle lobe volume loss and new volume loss in
the medial lingula.
3. Large hiatal hernia.
4. Right thyroid nodule. Consider further evaluation with thyroid
ultrasound. If patient is clinically hyperthyroid, consider nuclear
medicine thyroid uptake and scan.
5. Aortic atherosclerosis (2474T-170.0). Coronary artery
calcification.

## 2019-05-06 ENCOUNTER — Ambulatory Visit: Payer: MEDICARE | Admitting: Cardiology

## 2019-06-02 ENCOUNTER — Ambulatory Visit: Payer: MEDICARE | Admitting: Cardiology

## 2019-06-03 ENCOUNTER — Ambulatory Visit: Payer: MEDICARE | Admitting: Cardiology

## 2019-06-22 IMAGING — DX DG CHEST 2V
2 series · 2 of 2 positions shown · non-contrast
Comparison: 11/18/2017; correlation CT chest 12/06/2017

CLINICAL DATA: Cough, history GERD, CHF, hypertension,
bronchiectasis

EXAM:
CHEST - 2 VIEW

[chest pa]
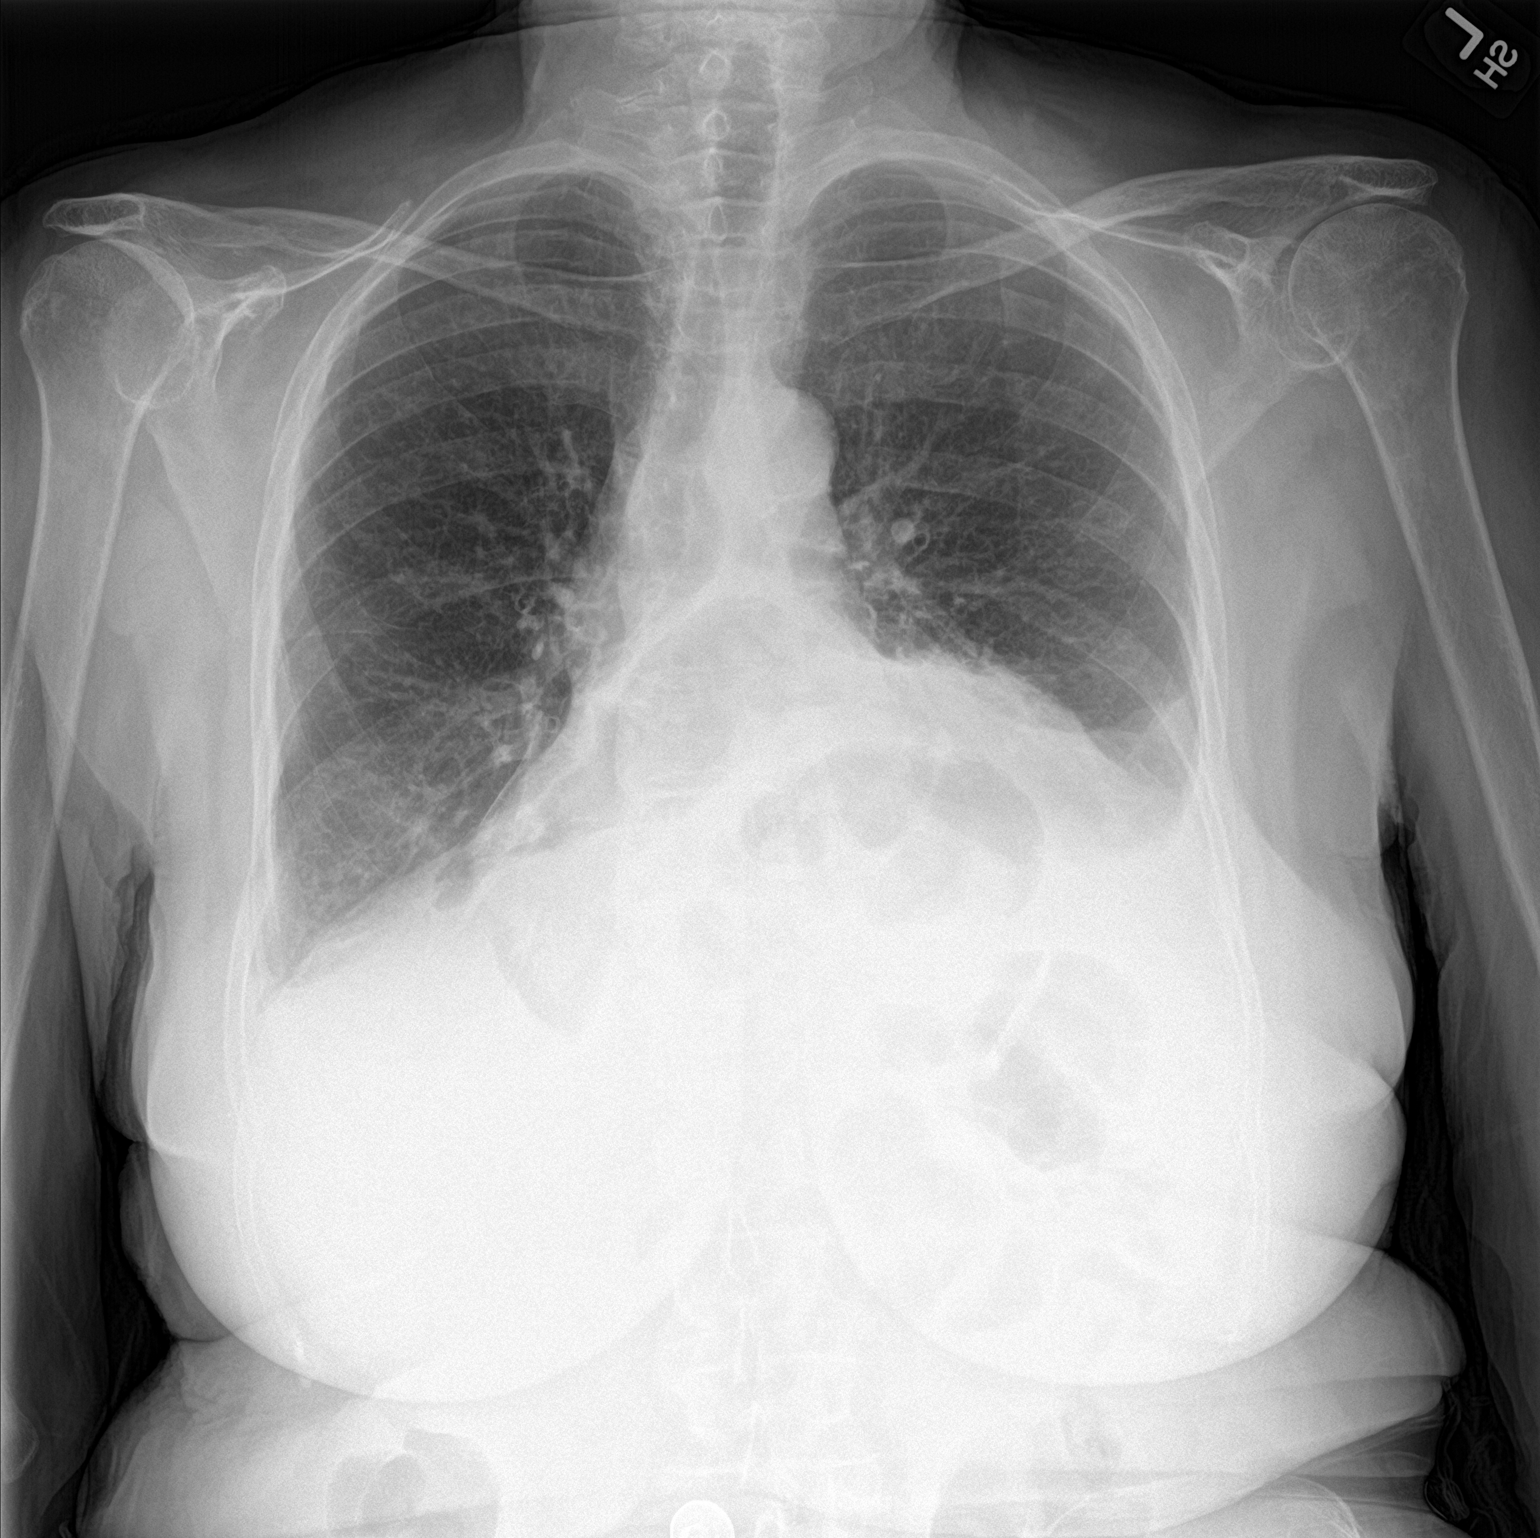

[chest lat]
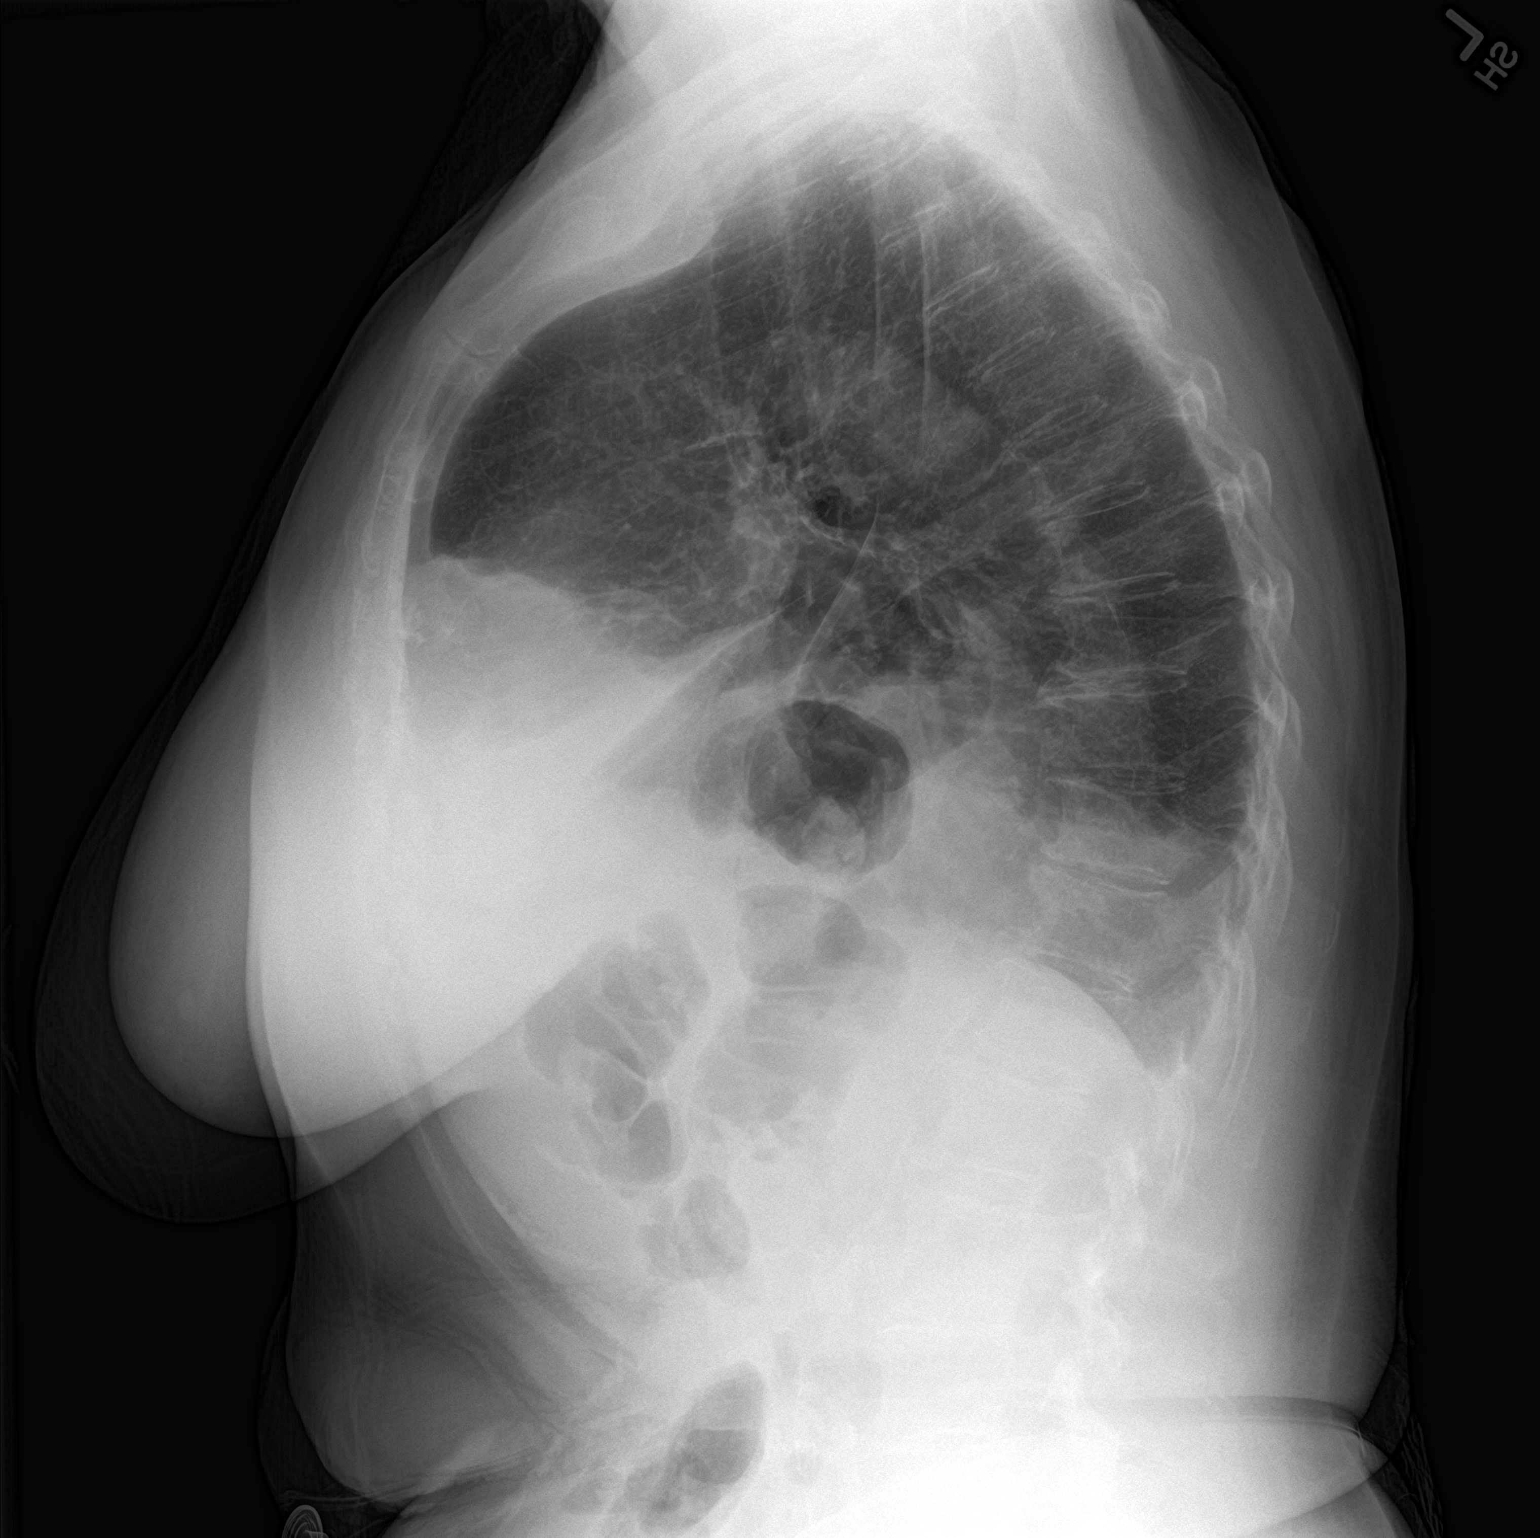

[2 of 2 positions shown; findings below may reference images not displayed]

FINDINGS: Enlargement of cardiac silhouette.

Large hiatal hernia.

Mediastinal contours and pulmonary vascularity normal.

Bronchitic changes with bibasilar atelectasis and small pleural
effusions greater on LEFT.

Upper lungs clear.

No pneumothorax.

Bones demineralized.
IMPRESSION: Enlargement of cardiac silhouette.

Large hiatal hernia.

Bronchitic and emphysematous changes with bibasilar pleural
effusions and atelectasis greater on LEFT.

## 2019-07-16 ENCOUNTER — Other Ambulatory Visit: Payer: Self-pay | Admitting: Cardiology

## 2019-07-21 ENCOUNTER — Other Ambulatory Visit: Payer: Self-pay | Admitting: Cardiology

## 2019-07-21 DIAGNOSIS — D649 Anemia, unspecified: Secondary | ICD-10-CM
